# Patient Record
Sex: Female | Born: 1990 | Race: Black or African American | Hispanic: No | Marital: Single | Smoking: Former smoker
Health system: Southern US, Community
[De-identification: ages and names within clinical notes are randomized; demographics above are authoritative.]

## PROBLEM LIST (undated history)

## (undated) ENCOUNTER — Inpatient Hospital Stay (HOSPITAL_COMMUNITY): Payer: Self-pay

## (undated) DIAGNOSIS — Z789 Other specified health status: Secondary | ICD-10-CM

## (undated) HISTORY — PX: WISDOM TOOTH EXTRACTION: SHX21

---
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
## 2012-01-19 NOTE — L&D Delivery Note (Signed)
Delivery Note At 3:39 AM a viable female was delivered via Vaginal, Spontaneous Delivery (Presentation: Right Occiput Anterior).  Loose nuchal cord, baby easily delivered through cord.  APGAR: 8, 9; weight pending.   Placenta status: Intact, Spontaneous.  Cord: 3 vessels with the following complications: None.  Cord pH: not collected  Anesthesia: Epidural  Episiotomy: None Lacerations: 2nd degree Suture Repair: 3.0 vicryl Est. Blood Loss (mL): 200  Mom to postpartum.  Baby to skin to skin immediately after delivery.  Breastfeeding Out patient circ Routine PP orders  Nadiah Corbit 09/07/2012, 4:36 AM

## 2012-04-21 ENCOUNTER — Encounter (HOSPITAL_COMMUNITY): Payer: Self-pay

## 2012-04-21 ENCOUNTER — Inpatient Hospital Stay (HOSPITAL_COMMUNITY)
Admission: AD | Admit: 2012-04-21 | Discharge: 2012-04-21 | Disposition: A | Discharge status: Home / Self Care | Payer: BC Managed Care – PPO | Source: Ambulatory Visit | Attending: Obstetrics and Gynecology | Admitting: Obstetrics and Gynecology

## 2012-04-21 DIAGNOSIS — R110 Nausea: Secondary | ICD-10-CM | POA: Insufficient documentation

## 2012-04-21 DIAGNOSIS — R109 Unspecified abdominal pain: Secondary | ICD-10-CM | POA: Insufficient documentation

## 2012-04-21 HISTORY — DX: Other specified health status: Z78.9

## 2012-04-21 LAB — URINE MICROSCOPIC-ADD ON

## 2012-04-21 LAB — URINALYSIS, ROUTINE W REFLEX MICROSCOPIC
Glucose, UA: NEGATIVE mg/dL
Protein, ur: NEGATIVE mg/dL
Specific Gravity, Urine: 1.025 (ref 1.005–1.030)
pH: 6 (ref 5.0–8.0)

## 2012-04-21 LAB — WET PREP, GENITAL

## 2012-04-21 MED ORDER — METRONIDAZOLE 500 MG PO TABS: 500.0000 mg | ORAL_TABLET | Freq: Two times a day (BID) | ORAL | Status: DC

## 2012-04-21 MED ORDER — NITROFURANTOIN MONOHYD MACRO 100 MG PO CAPS: 100.0000 mg | ORAL_CAPSULE | Freq: Two times a day (BID) | ORAL | Status: DC

## 2012-04-21 NOTE — MAU Note (Signed)
Patient states she has been having lower abdominal pain for a couple of weeks off and on, getting worse today. Denies bleeding or discharge. Does have nausea, no vomiting.

## 2012-09-06 ENCOUNTER — Inpatient Hospital Stay (HOSPITAL_COMMUNITY)
Admission: AD | Admit: 2012-09-06 | Discharge: 2012-09-06 | Disposition: A | Discharge status: Home / Self Care | Payer: BC Managed Care – PPO | Source: Ambulatory Visit | Attending: Obstetrics and Gynecology | Admitting: Obstetrics and Gynecology

## 2012-09-06 ENCOUNTER — Inpatient Hospital Stay (HOSPITAL_COMMUNITY): Payer: BC Managed Care – PPO | Admitting: Anesthesiology

## 2012-09-06 ENCOUNTER — Inpatient Hospital Stay (HOSPITAL_COMMUNITY)
Admission: AD | Admit: 2012-09-06 | Discharge: 2012-09-09 | DRG: 373 | Disposition: A | Discharge status: Home / Self Care | Payer: BC Managed Care – PPO | Source: Ambulatory Visit | Attending: Obstetrics and Gynecology | Admitting: Obstetrics and Gynecology

## 2012-09-06 ENCOUNTER — Encounter (HOSPITAL_COMMUNITY): Payer: Self-pay | Admitting: Anesthesiology

## 2012-09-06 ENCOUNTER — Encounter (HOSPITAL_COMMUNITY): Payer: Self-pay | Admitting: *Deleted

## 2012-09-06 DIAGNOSIS — O99820 Streptococcus B carrier state complicating pregnancy: Secondary | ICD-10-CM

## 2012-09-06 DIAGNOSIS — B373 Candidiasis of vulva and vagina: Secondary | ICD-10-CM | POA: Insufficient documentation

## 2012-09-06 DIAGNOSIS — E669 Obesity, unspecified: Secondary | ICD-10-CM | POA: Diagnosis present

## 2012-09-06 DIAGNOSIS — O360990 Maternal care for other rhesus isoimmunization, unspecified trimester, not applicable or unspecified: Secondary | ICD-10-CM | POA: Diagnosis present

## 2012-09-06 DIAGNOSIS — Z6791 Unspecified blood type, Rh negative: Secondary | ICD-10-CM | POA: Diagnosis present

## 2012-09-06 DIAGNOSIS — O26899 Other specified pregnancy related conditions, unspecified trimester: Secondary | ICD-10-CM | POA: Diagnosis present

## 2012-09-06 DIAGNOSIS — O479 False labor, unspecified: Secondary | ICD-10-CM | POA: Insufficient documentation

## 2012-09-06 DIAGNOSIS — A749 Chlamydial infection, unspecified: Secondary | ICD-10-CM | POA: Insufficient documentation

## 2012-09-06 DIAGNOSIS — Z22330 Carrier of Group B streptococcus: Secondary | ICD-10-CM

## 2012-09-06 DIAGNOSIS — IMO0001 Reserved for inherently not codable concepts without codable children: Secondary | ICD-10-CM

## 2012-09-06 DIAGNOSIS — N390 Urinary tract infection, site not specified: Secondary | ICD-10-CM | POA: Insufficient documentation

## 2012-09-06 DIAGNOSIS — O99892 Other specified diseases and conditions complicating childbirth: Secondary | ICD-10-CM | POA: Diagnosis present

## 2012-09-06 LAB — CBC
HCT: 37.3 % (ref 36.0–46.0)
Hemoglobin: 12.5 g/dL (ref 12.0–15.0)
MCV: 85.9 fL (ref 78.0–100.0)
RBC: 4.34 MIL/uL (ref 3.87–5.11)
WBC: 19.6 10*3/uL — ABNORMAL HIGH (ref 4.0–10.5)

## 2012-09-06 LAB — OB RESULTS CONSOLE RPR: RPR: NONREACTIVE

## 2012-09-06 LAB — OB RESULTS CONSOLE GC/CHLAMYDIA: Chlamydia: NEGATIVE

## 2012-09-06 LAB — OB RESULTS CONSOLE HEPATITIS B SURFACE ANTIGEN: Hepatitis B Surface Ag: NEGATIVE

## 2012-09-06 MED ORDER — OXYTOCIN BOLUS FROM INFUSION
500.0000 mL | INTRAVENOUS | Status: DC
  Administered 2012-09-07: 500 mL via INTRAVENOUS

## 2012-09-06 MED ORDER — OXYTOCIN 40 UNITS IN LACTATED RINGERS INFUSION - SIMPLE MED
62.5000 mL/h | INTRAVENOUS | Status: DC
  Administered 2012-09-07: 62.5 mL/h via INTRAVENOUS
  Filled 2012-09-06: qty 1000

## 2012-09-06 MED ORDER — LIDOCAINE HCL (PF) 1 % IJ SOLN
30.0000 mL | INTRAMUSCULAR | Status: DC | PRN
  Filled 2012-09-06 (×2): qty 30

## 2012-09-06 MED ORDER — LACTATED RINGERS IV SOLN: 500.0000 mL | INTRAVENOUS | Status: DC | PRN

## 2012-09-06 MED ORDER — PENICILLIN G POTASSIUM 5000000 UNITS IJ SOLR
5.0000 10*6.[IU] | Freq: Once | INTRAMUSCULAR | Status: AC
  Administered 2012-09-06: 5 10*6.[IU] via INTRAVENOUS
  Filled 2012-09-06: qty 5

## 2012-09-06 MED ORDER — PROMETHAZINE HCL 25 MG/ML IJ SOLN
12.5000 mg | Freq: Once | INTRAMUSCULAR | Status: AC
  Administered 2012-09-06: 12.5 mg via INTRAMUSCULAR
  Filled 2012-09-06: qty 1

## 2012-09-06 MED ORDER — PHENYLEPHRINE 40 MCG/ML (10ML) SYRINGE FOR IV PUSH (FOR BLOOD PRESSURE SUPPORT)
80.0000 ug | PREFILLED_SYRINGE | INTRAVENOUS | Status: DC | PRN
  Filled 2012-09-06: qty 2

## 2012-09-06 MED ORDER — LACTATED RINGERS IV SOLN
500.0000 mL | Freq: Once | INTRAVENOUS | Status: AC
  Administered 2012-09-06: 500 mL via INTRAVENOUS

## 2012-09-06 MED ORDER — CITRIC ACID-SODIUM CITRATE 334-500 MG/5ML PO SOLN: 30.0000 mL | ORAL | Status: DC | PRN

## 2012-09-06 MED ORDER — IBUPROFEN 600 MG PO TABS: 600.0000 mg | ORAL_TABLET | Freq: Four times a day (QID) | ORAL | Status: DC | PRN

## 2012-09-06 MED ORDER — ZOLPIDEM TARTRATE 5 MG PO TABS: 5.0000 mg | ORAL_TABLET | Freq: Every evening | ORAL | Status: DC | PRN

## 2012-09-06 MED ORDER — EPHEDRINE 5 MG/ML INJ
10.0000 mg | INTRAVENOUS | Status: DC | PRN
  Filled 2012-09-06: qty 2

## 2012-09-06 MED ORDER — OXYCODONE-ACETAMINOPHEN 5-325 MG PO TABS: 1.0000 | ORAL_TABLET | ORAL | Status: DC | PRN

## 2012-09-06 MED ORDER — LACTATED RINGERS IV SOLN
INTRAVENOUS | Status: DC
  Administered 2012-09-06: 23:00:00 via INTRAVENOUS

## 2012-09-06 MED ORDER — PHENYLEPHRINE 40 MCG/ML (10ML) SYRINGE FOR IV PUSH (FOR BLOOD PRESSURE SUPPORT)
80.0000 ug | PREFILLED_SYRINGE | INTRAVENOUS | Status: DC | PRN
  Filled 2012-09-06: qty 5
  Filled 2012-09-06: qty 2

## 2012-09-06 MED ORDER — ACETAMINOPHEN 325 MG PO TABS: 650.0000 mg | ORAL_TABLET | ORAL | Status: DC | PRN

## 2012-09-06 MED ORDER — BUTORPHANOL TARTRATE 1 MG/ML IJ SOLN
2.0000 mg | Freq: Once | INTRAMUSCULAR | Status: AC
  Administered 2012-09-06: 2 mg via INTRAMUSCULAR
  Filled 2012-09-06: qty 2

## 2012-09-06 MED ORDER — DIPHENHYDRAMINE HCL 50 MG/ML IJ SOLN: 12.5000 mg | INTRAMUSCULAR | Status: DC | PRN

## 2012-09-06 MED ORDER — ONDANSETRON HCL 4 MG/2ML IJ SOLN: 4.0000 mg | Freq: Four times a day (QID) | INTRAMUSCULAR | Status: DC | PRN

## 2012-09-06 MED ORDER — FENTANYL 2.5 MCG/ML BUPIVACAINE 1/10 % EPIDURAL INFUSION (WH - ANES)
14.0000 mL/h | INTRAMUSCULAR | Status: DC | PRN
  Administered 2012-09-06: 14 mL/h via EPIDURAL
  Filled 2012-09-06: qty 125

## 2012-09-06 MED ORDER — SODIUM BICARBONATE 8.4 % IV SOLN
INTRAVENOUS | Status: DC | PRN
  Administered 2012-09-06: 5 mL via EPIDURAL

## 2012-09-06 MED ORDER — PENICILLIN G POTASSIUM 5000000 UNITS IJ SOLR
2.5000 10*6.[IU] | INTRAMUSCULAR | Status: DC
  Administered 2012-09-07: 2.5 10*6.[IU] via INTRAVENOUS
  Filled 2012-09-06 (×5): qty 2.5

## 2012-09-06 MED ORDER — EPHEDRINE 5 MG/ML INJ
10.0000 mg | INTRAVENOUS | Status: DC | PRN
  Filled 2012-09-06: qty 4
  Filled 2012-09-06: qty 2

## 2012-09-06 NOTE — Anesthesia Preprocedure Evaluation (Signed)

## 2012-09-06 NOTE — Anesthesia Procedure Notes (Signed)

## 2012-09-06 NOTE — MAU Note (Signed)
UC's since 0530, regular since 0600. No leaking or bleeding. States was not dilated when examined in office yesterday.

## 2012-09-06 NOTE — MAU Note (Signed)
Pt presents with increase in contractions since previous discharge earlier today.  Pt denies pih symptoms , srom, bleeding.  + fm per pt.

## 2012-09-06 NOTE — Progress Notes (Signed)
  Subjective: Pt comfortable now with epidural.  Discussed R&B of AROM, pt voiced understanding and wished to proceed with AROM.  Objective: BP 118/64  Pulse 108  Temp(Src) 98 F (36.7 C) (Oral)  Resp 18  Ht 5\' 4"  (1.626 m)  Wt 218 lb 12.8 oz (99.247 kg)  BMI 37.54 kg/m2  SpO2 100%      FHT:  Cat II with x1 decel at 2357 UC:   regular, every 1.5-5 minutes  SVE:   Dilation: 5 Effacement (%): 90 Station: -1 Exam by:: J. Idaly Verret CNM  Assessment / Plan:  Labor: Active labor; AROM at 2352 - light mec Preeclampsia: no s/s Fetal Wellbeing:Cat II Pain Control: Epidural  I/D: GBS pos; AROM; afibrile Anticipated MOD: SVD   Courtney Moses 09/06/2012, 11:59 PM

## 2012-09-06 NOTE — MAU Provider Note (Signed)
  History   pt came in c/o ctxs.  No LOF or VB good Fm  CSN: [id number]  Arrival date and time: 09/06/12 4098   None     Chief Complaint  Patient presents with  . Contractions   HPI  OB History   Grav Para Term Preterm Abortions TAB SAB Ect Mult Living   1         0      Past Medical History  Diagnosis Date  . Medical history non-contributory     Past Surgical History  Procedure Laterality Date  . Wisdom tooth extraction      No family history on file.  History  Substance Use Topics  . Smoking status: Never Smoker   . Smokeless tobacco: Not on file  . Alcohol Use: No     Comment: occassionally    Allergies: No Known Allergies  Prescriptions prior to admission  Medication Sig Dispense Refill  . metroNIDAZOLE (FLAGYL) 500 MG tablet Take 1 tablet (500 mg total) by mouth 2 (two) times daily.  14 tablet  0  . nitrofurantoin, macrocrystal-monohydrate, (MACROBID) 100 MG capsule Take 1 capsule (100 mg total) by mouth 2 (two) times daily.  14 capsule  0    ROS Physical Exam   Blood pressure 123/75, pulse 92, temperature 98.6 F (37 C), temperature source Oral, resp. rate 20, height 5\' 4"  (1.626 m), weight 222 lb (100.699 kg), SpO2 99.00%.  Physical Exam cx FT/50?-3 MAU Course  Procedures  MDM   Assessment and Plan  NSt category 1 Pt will walk for one hour. If no cervical change DC home with labor precautions.    Ronny Ruddell A 09/06/2012, 10:09 AM

## 2012-09-06 NOTE — H&P (Signed)
Courtney Moses is a 22 y.o. female, G1P0 at [redacted]w[redacted]d, presenting for UC's since 0530, regular since 0600.  Denies VB, LOF, recent fever, resp or GI c/o's, UTI or PIH s/s. GFM. Desires epidural. .  Patient Active Problem List   Diagnosis Date Noted  . Chlamydia infection, current pregnancy 09/06/2012  . Candidal vulvovaginitis 09/06/2012  . UTI (urinary tract infection) 09/06/2012  . GBS (group B Streptococcus carrier), +RV culture, currently pregnant 09/06/2012  . Rh negative status during pregnancy 09/06/2012  . Active labor at term 09/06/2012    History of present pregnancy: Patient entered care at 22 weeks.   EDC of 09/18/12 was established by LMP.   Anatomy scan:  22 weeks, limited with normal findings and an anterior placenta.   Additional [place] evaluations:  27 weeks for anatomy f/u - anatomy scan completed.   Significant prenatal events:  CT infection in early pregnancy; Rhogam at 34 weeks   Last evaluation:  09/05/12 at [redacted]w[redacted]d   0 cm / 50% / -3  OB History   Grav Para Term Preterm Abortions TAB SAB Ect Mult Living   1         0     Past Medical History  Diagnosis Date  . Medical history non-contributory    Past Surgical History  Procedure Laterality Date  . Wisdom tooth extraction     Family History: family history is not on file. Social History:  reports that she has never smoked. She does not have any smokeless tobacco history on file. She reports that she does not drink alcohol or use illicit drugs.   Prenatal Transfer Tool  Maternal Diabetes: No Genetic Screening: Normal Maternal Ultrasounds/Referrals: Normal Fetal Ultrasounds or other Referrals:  None Maternal Substance Abuse:  No Significant Maternal Medications:  None Significant Maternal Lab Results: Lab values include: Group B Strep positive, Rh negative    ROS: see HPI above, all other systems are negative  No Known Allergies   Dilation: 4.5 Effacement (%): 70 Station: -2 Exam by:: a. harper,  rnc-ob Blood pressure 122/73, pulse 91, temperature 96.6 F (35.9 C), temperature source Axillary, resp. rate 20, height 5\' 4"  (1.626 m), weight 218 lb 12.8 oz (99.247 kg).  Chest clear Heart RRR without murmur Abd gravid, NT Ext: WNL  FHR: Reactive NST UCs:  Q 3-6 min  Prenatal labs: ABO, Rh: O/Negative/-- (08/20 0000) Antibody: Negative (08/20 0000) Rubella:   Immune RPR: Nonreactive (08/20 0000)  HBsAg: Negative (08/20 0000)  HIV: Non-reactive (08/20 0000)  GBS: Positive (08/20 0000) Sickle cell/Hgb electrophoresis:  Hemoglobin A2 is low - no clinical significance Pap:  unknown GC:  8/19 neg Chlamydia:  8/19 neg  (4/30 - pos) Genetic screenings:  AFP neg Glucola:  84 Other:  none  Assessment/Plan: IUP at [redacted]w[redacted]d Active labor GBS pos Desires epidural  Admit to BS per c/w Dr. Estanislado Pandy as attending MD Routine [facility] orders GBS PCN prophylaxis per protocol Epidural prn  Rowan Blase, MSN 09/06/2012, 8:56 PM

## 2012-09-07 ENCOUNTER — Encounter (HOSPITAL_COMMUNITY): Payer: Self-pay | Admitting: *Deleted

## 2012-09-07 MED ORDER — SENNOSIDES-DOCUSATE SODIUM 8.6-50 MG PO TABS
2.0000 | ORAL_TABLET | Freq: Every day | ORAL | Status: DC
  Administered 2012-09-07 – 2012-09-08 (×2): 2 via ORAL

## 2012-09-07 MED ORDER — TETANUS-DIPHTH-ACELL PERTUSSIS 5-2.5-18.5 LF-MCG/0.5 IM SUSP: 0.5000 mL | Freq: Once | INTRAMUSCULAR | Status: DC

## 2012-09-07 MED ORDER — OXYCODONE-ACETAMINOPHEN 5-325 MG PO TABS: 1.0000 | ORAL_TABLET | ORAL | Status: DC | PRN

## 2012-09-07 MED ORDER — IBUPROFEN 600 MG PO TABS
600.0000 mg | ORAL_TABLET | Freq: Four times a day (QID) | ORAL | Status: DC
  Administered 2012-09-07 – 2012-09-09 (×8): 600 mg via ORAL
  Filled 2012-09-07 (×8): qty 1

## 2012-09-07 MED ORDER — ONDANSETRON HCL 4 MG/2ML IJ SOLN: 4.0000 mg | INTRAMUSCULAR | Status: DC | PRN

## 2012-09-07 MED ORDER — ONDANSETRON HCL 4 MG PO TABS: 4.0000 mg | ORAL_TABLET | ORAL | Status: DC | PRN

## 2012-09-07 MED ORDER — DIPHENHYDRAMINE HCL 25 MG PO CAPS: 25.0000 mg | ORAL_CAPSULE | Freq: Four times a day (QID) | ORAL | Status: DC | PRN

## 2012-09-07 MED ORDER — WITCH HAZEL-GLYCERIN EX PADS: 1.0000 "application " | MEDICATED_PAD | CUTANEOUS | Status: DC | PRN

## 2012-09-07 MED ORDER — LANOLIN HYDROUS EX OINT: TOPICAL_OINTMENT | CUTANEOUS | Status: DC | PRN

## 2012-09-07 MED ORDER — PRENATAL MULTIVITAMIN CH
1.0000 | ORAL_TABLET | Freq: Every day | ORAL | Status: DC
  Administered 2012-09-07 – 2012-09-09 (×3): 1 via ORAL
  Filled 2012-09-07 (×3): qty 1

## 2012-09-07 MED ORDER — DIBUCAINE 1 % RE OINT: 1.0000 "application " | TOPICAL_OINTMENT | RECTAL | Status: DC | PRN

## 2012-09-07 MED ORDER — BENZOCAINE-MENTHOL 20-0.5 % EX AERO
1.0000 "application " | INHALATION_SPRAY | CUTANEOUS | Status: DC | PRN
  Administered 2012-09-07: 1 via TOPICAL
  Filled 2012-09-07: qty 56

## 2012-09-07 MED ORDER — MEDROXYPROGESTERONE ACETATE 150 MG/ML IM SUSP: 150.0000 mg | INTRAMUSCULAR | Status: DC | PRN

## 2012-09-07 MED ORDER — ZOLPIDEM TARTRATE 5 MG PO TABS: 5.0000 mg | ORAL_TABLET | Freq: Every evening | ORAL | Status: DC | PRN

## 2012-09-07 MED ORDER — SIMETHICONE 80 MG PO CHEW: 80.0000 mg | CHEWABLE_TABLET | ORAL | Status: DC | PRN

## 2012-09-07 NOTE — Progress Notes (Addendum)
  Subjective: Pt resting in a light sleep.  Pt feeling more "lower abdominal pressure" with UCs.   Objective: BP 122/70  Pulse 102  Temp(Src) 98.9 F (37.2 C) (Oral)  Resp 18  Ht 5\' 4"  (1.626 m)  Wt 218 lb 12.8 oz (99.247 kg)  BMI 37.54 kg/m2  SpO2 99%      FHT:  Cat II UC:   regular, every 1.5 - 2.5 minutes  SVE:   Dilation: 10 Effacement (%): 100 Station: 0 Exam by:: Annamary Rummage, CNM  Assessment / Plan:  Labor: Active labor; Complete; Will labor down for 1 hour Preeclampsia: no s/s  Fetal Wellbeing:Cat II  Pain Control: Epidural  I/D: GBS pos; AROM x 2 hours; afibrile  Anticipated MOD: SVD   Courtney Moses 09/07/2012, 2:15 AM

## 2012-09-08 LAB — CBC
Hemoglobin: 10.3 g/dL — ABNORMAL LOW (ref 12.0–15.0)
MCH: 29.4 pg (ref 26.0–34.0)
MCV: 87.4 fL (ref 78.0–100.0)
RBC: 3.5 MIL/uL — ABNORMAL LOW (ref 3.87–5.11)

## 2012-09-08 MED ORDER — RHO D IMMUNE GLOBULIN 1500 UNIT/2ML IJ SOLN
300.0000 ug | Freq: Once | INTRAMUSCULAR | Status: AC
  Administered 2012-09-08: 300 ug via INTRAMUSCULAR
  Filled 2012-09-08: qty 2

## 2012-09-08 NOTE — Progress Notes (Signed)
Post Partum Day 1 Subjective: no complaints, up ad lib, voiding and tolerating PO  Objective: Blood pressure 117/77, pulse 87, temperature 97.5 F (36.4 C), temperature source Axillary, resp. rate 18, height 5\' 4"  (1.626 m), weight 218 lb 12.8 oz (99.247 kg), SpO2 96.00%, bottle feeding  Physical Exam:  General: alert, cooperative, no distress and mildly obese Lochia: appropriate Uterine Fundus: firm DVT Evaluation: No evidence of DVT seen on physical exam.   Recent Labs  09/06/12 2015 09/08/12 0610  HGB 12.5 10.3*  HCT 37.3 30.6*    Assessment/Plan: Plan for discharge tomorrow   LOS: 2 days   Marl Seago P 09/08/2012, 10:19 AM

## 2012-09-08 NOTE — Anesthesia Postprocedure Evaluation (Signed)
  Anesthesia Post-op Note  Patient: Courtney Moses  Procedure(s) Performed: * No procedures listed *  Patient Location: PACU and Mother/Baby  Anesthesia Type:Epidural  Level of Consciousness: awake, alert  and oriented  Airway and Oxygen Therapy: Patient Spontanous Breathing  Post-op Pain: none  Post-op Assessment: Post-op Vital signs reviewed, Patient's Cardiovascular Status Stable and Respiratory Function Stable  Post-op Vital Signs: Reviewed and stable  Complications: No apparent anesthesia complications

## 2012-09-09 LAB — RH IG WORKUP (INCLUDES ABO/RH)

## 2012-09-09 MED ORDER — IBUPROFEN 600 MG PO TABS: 600.0000 mg | ORAL_TABLET | Freq: Four times a day (QID) | ORAL | Status: DC | PRN

## 2012-09-09 NOTE — Discharge Summary (Signed)
Obstetric Discharge Summary Reason for Admission: onset of labor Prenatal Procedures: ultrasound Intrapartum Procedures: spontaneous vaginal delivery Postpartum Procedures: none Complications-Operative and Postpartum: 2nd degree perineal laceration Hemoglobin  Date Value Range Status  09/08/2012 10.3* 12.0 - 15.0 g/dL Final     DELTA CHECK NOTED     REPEATED TO VERIFY     HCT  Date Value Range Status  09/08/2012 30.6* 36.0 - 46.0 % Final    Physical Exam:  General: alert, cooperative and no distress Heart:  RRR Lungs:  CTA bilat Breasts:  Soft Abd:  Soft, NT with pos BS x 4 quads Lochia: appropriate, sm rubra Uterine Fundus: firm, NT, 2 below umb Incision: Perineal laceration repair intact DVT Evaluation: No evidence of DVT seen on physical exam. Negative Homan's sign. No significant calf/ankle edema.  Discharge Diagnoses: Term Pregnancy-delivered  Discharge Information: Date: 09/09/2012 Activity: unrestricted Diet: routine Medications: PNV and Ibuprofen Condition: stable Instructions: refer to practice specific booklet Discharge to: home Follow-up Information   Follow up with [facility]. Schedule an appointment as soon as possible for a visit in 6 weeks.   Contact information:   [address] [place] [address]     Contraception:  Desires OCPs to start at 6wk PP visit  Newborn Data: Live born female  Birth Weight: 5 lb 13 oz (2637 g) APGAR: 8, 9 Plans office circ  Home with mother.  Geetika Laborde O. 09/09/2012, 11:29 AM

## 2012-09-09 NOTE — Progress Notes (Signed)
Post Partum Day 2 Subjective: Reports feeling well.  Ambulating, voiding and tol po liquids and solids without difficulty.  Denies weakness or dizziness.  Pos flatus, neg BM.  States she now is going to formula feed exclusively.  Reports mild perineal pain well controlled with Motrin only.  Objective: Blood pressure 109/73, pulse 78, temperature 97.8 F (36.6 C), temperature source Oral, resp. rate 18, height 5\' 4"  (1.626 m), weight 99.247 kg (218 lb 12.8 oz), SpO2 100.00%, unknown if currently breastfeeding.  Physical Exam:  General: alert, cooperative and no distress Heart:  RRR Lungs:  CTA bilat Breasts:  Soft Lochia: appropriate, sm rubra Uterine Fundus: firm, NT 2 below umb. Incision: Perineal laceration repair intact DVT Evaluation: No evidence of DVT seen on physical exam. Negative Homan's sign. No significant calf/ankle edema.   Recent Labs  09/06/12 2015 09/08/12 0610  HGB 12.5 10.3*  HCT 37.3 30.6*    Assessment/Plan: Stable s/p vaginal del at 38w 3d  Discharge to home. Discharge instructions reviewed. RTO 6 wks for f/u. Pt desires to use OCPs for contraception but declines rx at present.   Rx: Motrin, PNV   LOS: 3 days   Courtney Moses O. 09/09/2012, 11:25 AM

## 2012-09-10 LAB — TYPE AND SCREEN
ABO/RH(D): O NEG
Antibody Screen: POSITIVE
DAT, IgG: NEGATIVE
Unit division: 0

## 2013-11-19 ENCOUNTER — Encounter (HOSPITAL_COMMUNITY): Payer: Self-pay | Admitting: *Deleted

## 2015-01-17 ENCOUNTER — Emergency Department (HOSPITAL_COMMUNITY)
Admission: EM | Admit: 2015-01-17 | Discharge: 2015-01-17 | Disposition: A | Discharge status: Home / Self Care | Payer: BLUE CROSS/BLUE SHIELD | Attending: Emergency Medicine | Admitting: Emergency Medicine

## 2015-01-17 ENCOUNTER — Encounter (HOSPITAL_COMMUNITY): Payer: Self-pay | Admitting: Emergency Medicine

## 2015-01-17 DIAGNOSIS — R0602 Shortness of breath: Secondary | ICD-10-CM | POA: Diagnosis present

## 2015-01-17 DIAGNOSIS — F17200 Nicotine dependence, unspecified, uncomplicated: Secondary | ICD-10-CM | POA: Insufficient documentation

## 2015-01-17 NOTE — Discharge Instructions (Signed)
Do not hesitate to return to the emergency room for any new, worsening or concerning symptoms.  Please obtain primary care using resource guide below. Let them know that you were seen in the emergency room and that they will need to obtain records for further outpatient management.   Shortness of Breath Shortness of breath means you have trouble breathing. It could also mean that you have a medical problem. You should get immediate medical care for shortness of breath. CAUSES   Not enough oxygen in the air such as with high altitudes or a smoke-filled room.  Certain lung diseases, infections, or problems.  Heart disease or conditions, such as angina or heart failure.  Low red blood cells (anemia).  Poor physical fitness, which can cause shortness of breath when you exercise.  Chest or back injuries or stiffness.  Being overweight.  Smoking.  Anxiety, which can make you feel like you are not getting enough air. DIAGNOSIS  Serious medical problems can often be found during your physical exam. Tests may also be done to determine why you are having shortness of breath. Tests may include:  Chest X-rays.  Lung function tests.  Blood tests.  An electrocardiogram (ECG).  An ambulatory electrocardiogram. An ambulatory ECG records your heartbeat patterns over a 24-hour period.  Exercise testing.  A transthoracic echocardiogram (TTE). During echocardiography, sound waves are used to evaluate how blood flows through your heart.  A transesophageal echocardiogram (TEE).  Imaging scans. Your health care provider may not be able to find a cause for your shortness of breath after your exam. In this case, it is important to have a follow-up exam with your health care provider as directed.  TREATMENT  Treatment for shortness of breath depends on the cause of your symptoms and can vary greatly. HOME CARE INSTRUCTIONS   Do not smoke. Smoking is a common cause of shortness of breath. If  you smoke, ask for help to quit.  Avoid being around chemicals or things that may bother your breathing, such as paint fumes and dust.  Rest as needed. Slowly resume your usual activities.  If medicines were prescribed, take them as directed for the full length of time directed. This includes oxygen and any inhaled medicines.  Keep all follow-up appointments as directed by your health care provider. SEEK MEDICAL CARE IF:   Your condition does not improve in the time expected.  You have a hard time doing your normal activities even with rest.  You have any new symptoms. SEEK IMMEDIATE MEDICAL CARE IF:   Your shortness of breath gets worse.  You feel light-headed, faint, or develop a cough not controlled with medicines.  You start coughing up blood.  You have pain with breathing.  You have chest pain or pain in your arms, shoulders, or abdomen.  You have a fever.  You are unable to walk up stairs or exercise the way you normally do. MAKE SURE YOU:  Understand these instructions.  Will watch your condition.  Will get help right away if you are not doing well or get worse.   This information is not intended to replace advice given to you by your health care provider. Make sure you discuss any questions you have with your health care provider.   Document Released: 09/29/2000 Document Revised: 01/09/2013 Document Reviewed: 03/22/2011 Elsevier Interactive Patient Education 2016 [employer]   Emergency Department Resource Guide 1) Find a Doctor and Pay Out of Pocket Although you won't have to find out who  is covered by your insurance plan, it is a good idea to ask around and get recommendations. You will then need to call the office and see if the doctor you have chosen will accept you as a new patient and what types of options they offer for patients who are self-pay. Some doctors offer discounts or will set up payment plans for their patients who do not have insurance, but  you will need to ask so you aren't surprised when you get to your appointment.  2) Contact Your Local Health Department Not all health departments have doctors that can see patients for sick visits, but many do, so it is worth a call to see if yours does. If you don't know where your local health department is, you can check in your phone book. The CDC also has a tool to help you locate your state's health department, and many state websites also have listings of all of their local health departments.  3) Find a Walk-in Clinic If your illness is not likely to be very severe or complicated, you may want to try a walk in clinic. These are popping up all over the country in pharmacies, drugstores, and shopping centers. They're usually staffed by nurse practitioners or physician assistants that have been trained to treat common illnesses and complaints. They're usually fairly quick and inexpensive. However, if you have serious medical issues or chronic medical problems, these are probably not your best option.  No Primary Care Doctor: - Call Health Connect at  [phone] - they can help you locate a primary care doctor that  accepts your insurance, provides certain services, etc. - Physician Referral Service- [phone]  Chronic Pain Problems: Organization         Address  Phone   Notes  [facility]  [phone] Patients need to be referred by their primary care doctor.   Medication Assistance: Organization         Address  Phone   Notes  [facility] Medication [facility] [address] [place] [place], [place] [address] [phone] --Must be a resident of [facility] -- Must have NO insurance coverage whatsoever (no Medicaid/ Medicare, etc.) -- The pt. MUST have a primary care doctor that directs their care regularly and follows them in the community   [facility]ist  [phone]   [employer]  [phone]    Agencies that provide inexpensive  medical care: Organization         Address  Phone   Notes  [facility]  [phone]   [facility]    [phone]   [facility] [address] [place], [place] [address] [phone]   [facility] [address] [place] [place] [phone]   Planned Parenthood    [phone]   [facility]    [phone]   Community Health and [facility]  [address], [place] Phone:  [phone], Fax:  [phone] Hours of Operation:  9 am - 6 pm, M-F.  Also accepts Medicaid/Medicare and self-pay.  [facility]  [address], [place] Phone: [phone], Fax: [phone]. Hours of Operation:  8:30 am - 5:30 pm, M-F.  Also accepts Medicaid and self-pay.  [facility] [place] [address], [place] Phone: [phone]   [facility] [address] Natasha Bence [place], [place] [phone],  Ext. [phone] Mondays & Thursdays: 7-9 AM.  First 15 patients are seen on a first come, first serve basis.    Medicaid-accepting [facility] Providers:  Organization         Address  Phone   Notes  [facility] [address], [place] [phone]) [phone] Also accepts self-pay patients.  [facility] [address], [place]  [phone]) [phone]   [facility] [address], [place] [phone]) [phone]   [facility] [address] [place] [address], [place] [phone]   Renaye RakersVeita Bland [address], [place]   [phone] Only accepts [place] Access [place] patients after they have their name applied to their card.   Self-Pay (no insurance) in [facility]:  Organization         Address  Phone   Notes  Sickle Cell Patients, [facility] [address], [place] [phone]) [phone]   [facility] [address], [place] [phone]) [phone]   [facility]  [address], [place] [phone]) [phone]   [facility]/Dr. Osei-Bonsu  [address] [place] [address], [place] or [phone], [place] [phone] Phone number for both [place] and [place] locations is the same.  [facility] [address], [place] [phone]) [phone]   [facility] [address] [place] [address], [place] or [address] [phone]) [phone]   [facility] [address], [place] [phone]) [phone], phone; [phone]) [phone], fax Sees patients 1st and 3rd Saturday of every month.  Must not qualify for public or private insurance (i.e. Medicaid, Medicare, [place] Health Choice, Veterans' Benefits)  Household income should be no more than 200% of the poverty level The clinic cannot treat you if you are pregnant or think you are pregnant  Sexually transmitted diseases are not treated at the clinic.    Dental Care: Organization         Address  Phone  Notes  [facility] Department of [facility] [address], [place] [phone] Accepts children up to age 22 who are enrolled in [place] or [place] Health Choice; pregnant women with a Medicaid card; and children who have applied for Medicaid or [place] Health Choice, but were declined, whose parents can pay a reduced fee at time of service.  [facility] of [facility] [place]  [address], [place] [phone]) [phone] Accepts children up to age 22 who are enrolled in [place] or [place] Health Choice; pregnant women with a Medicaid card; and children who have applied for Medicaid or [place] Health Choice, but were declined, whose parents can pay a reduced fee at time of service.  [facility]  [address], [place] [phone]) [phone] Patients are seen by appointment only. Walk-ins are not accepted.  [facility] will see patients 22 years of age and older. Monday - Tuesday (8am-5pm) Most Wednesdays (8:30-5pm) $30 per visit, cash only  [facility]  [address], [facility] [phone]) [phone] Patients are seen by appointment only. Walk-ins are not accepted. [facility] will see patients 22 years of age and older. One Wednesday Evening (Monthly: Volunteer Based).  $30 per visit, cash only  [facility]  [phone]  for adults; Children under age 26, call Graduate Pediatric Dentistry at [phone]. Children aged 49-14, please call [phone] to request a pediatric application.  Dental services are provided in all areas of dental care including fillings, crowns and bridges, complete and partial dentures, implants, gum treatment, root canals, and extractions. Preventive care is also provided. Treatment is provided to both adults and children. Patients are selected via a lottery and there is often a waiting list.   [facility] [address], [place]  [phone] [email or web address]   [facility] [address] [place], [place] [phone], Ext. [phone] Second and Fourth Thursday of each month, opens at 6:30 AM; Clinic ends at 9 AM.  Patients are seen on a first-come first-served basis, and a limited number are seen during each clinic.   [facility]  [address] Ether [address]ins [place], [place] [phone]   Eligibility Requirements You must have lived in [place], [place], or [place] counties for at least the last three months.   You cannot be eligible for state or federal sponsored [place], including [employer], [place], or [employer].   You generally cannot be eligible for healthcare insurance through your employer.    How to apply: Eligibility screenings are held every Tuesday and Wednesday afternoon from 1:00 pm until 4:00 pm. You do not need an appointment for the  interview!  [facility] [address] [place], [place] [phone]   [facility] Health Department  [phone]   [facility] Health Department  [phone]   [facility] Health Department  [phone]    Behavioral Health Resources in the Community: Intensive Outpatient Programs Organization         Address  Phone  Notes  [facility] [address], [place], [place] [phone]   [facility]patient [address] [address] [place], [place] [phone]   [facility] [address] [place], [place]  [phone]   [facility] Mental Health [address] [place] [address]  [place], [place] [phone] or [phone]   Substance Abuse Resources Organization         Address  Phone  Notes  [facility] Drug [facility]  [phone]   [facility]  [phone]   [facility] [place]  [phone]   Floydene Flock  [phone]   Residential & Outpatient Substance Abuse Program  [phone]   Psychological Services Organization         Address  Phone  Notes  [facility] Behavioral Health  [phone]   [facility]  [phone]   [facility] Mental Health [address] [place] [phone] or [phone]    Mobile Crisis Teams Organization         Address  Phone  Notes  [facility], Mobile Crisis Care Unit  [phone]   [facility]  [address] [place], [place] [phone]   Doristine Locks [address] [place] [phone]    Self-Help/Support Groups Organization         Address  Phone             Notes  [facility] [place] - variety of support groups  [phone]- I7437963 Call for more information  Narcotics Anonymous (NA), [facility] [address], [employer] [place]  2 meetings at this location   Chief Executive Officer  Notes  ASAP  Residential Treatment  [address]    [place]  [phone]   [facility]  [address] [place] [address], [place], [place] [phone]   [facility] Treatment Facility [address] [place], [place] [phone] Admissions: 8am-3pm M-F  [facility] [address],    [place], [place] [phone]   [facility] [address] [place], [place], [place] [phone]   [facility] [address]  [place], [place] [phone]   [facility] - Intensive Outpatient [address], [place], [place] [phone]   [facility] ([facility] [address] [place]  [place], [place] [phone] or [phone]   [facility] ([facility]) [address], [place], [place] [phone] Accepts Medicaid  [facility] [place] [address],  [place] [phone] Substance Abuse/Addiction Treatment   [facility] Organization         Address  Phone  Notes  [facility]  [phone]   Angie Fava, PhD [address] Ervin Knack [place], [place]   [phone] or [phone]   [facility]   [address] [place], [place] [phone]   [facility] [address] [place], [place] [phone] Insurance/Medicaid/sponsorship through [facility] [address]                                    [place], [place] [phone] Therapy/tele-psych/case  [facility] [address], [place] [phone]    Dr. Lolly Mustache  [phone]   [facility] [place]  [employer] [facility] Dept. 1) [address] [place] 2) [address], [place] 3)  [address], [place] [phone]  [phone]   [facility] Child Abuse Hotline [phone] or [phone] (After Hours)

## 2015-01-17 NOTE — ED Provider Notes (Signed)
CSN: [id number]     Arrival date & time 01/17/15  0432 History   First MD Initiated Contact with Patient 01/17/15 [phone]     (Consider location/radiation/quality/duration/timing/severity/associated sxs/prior Treatment) HPI   Blood pressure 123/58, pulse 97, temperature 98 F (36.7 C), temperature source Oral, resp. rate 18, last menstrual period 01/10/2015, SpO2 100 %, not currently breastfeeding.  Courtney Moses is a 24 y.o. female BIBEMS for acute onset of shortness of breath, patient got off work went home, took a shower and laid down and immediately felt her throat closing. EMS was called they gave her an albuterol treatment which immediately resolved her symptoms, was given 125 of Solu-Medrol, 50 of Benadryl and route. She denies rash, nausea, vomiting, history of allergies, new medications, new pets, soaps, lotions, detergents. Patient states that taking about issues father at the onset of the episode of shortness of breath. She denies suicidal ideation, homicidal ideation, alcohol or drug abuse, chest pain, cough, fever, history of DVT or PE, recent immobilizations, calf pain, leg swelling.   Past Medical History  Diagnosis Date  . Medical history non-contributory    Past Surgical History  Procedure Laterality Date  . Wisdom tooth extraction     No family history on file. Social History  Substance Use Topics  . Smoking status: Current Some Day Smoker  . Smokeless tobacco: None  . Alcohol Use: No     Comment: occassionally   OB History    Gravida Para Term Preterm AB TAB SAB Ectopic Multiple Living   Review of Systems  10 systems reviewed and found to be negative, except as noted in the HPI.   Allergies  Review of patient's allergies indicates no known allergies.  Home Medications   Prior to Admission medications   Medication Sig Start Date End Date Taking? Authorizing Provider  ibuprofen (ADVIL,MOTRIN) 600 MG tablet Take 1 tablet (600 mg total) by  mouth every 6 (six) hours as needed for pain. 09/09/12   Elsie Ra, CNM   BP 123/58 mmHg  Pulse 97  Temp(Src) 98 F (36.7 C) (Oral)  Resp 18  SpO2 100%  LMP 01/10/2015  Breastfeeding? No Physical Exam  Constitutional: She is oriented to person, place, and time. She appears well-developed and well-nourished. No distress.  HENT:  Head: Normocephalic and atraumatic.  Mouth/Throat: Oropharynx is clear and moist.  Eyes: Conjunctivae and EOM are normal. Pupils are equal, round, and reactive to light.  Neck: Normal range of motion. No JVD present. No tracheal deviation present.  Cardiovascular: Normal rate, regular rhythm and intact distal pulses.   Radial pulse equal bilaterally  Pulmonary/Chest: Effort normal and breath sounds normal. No stridor. No respiratory distress. She has no wheezes. She has no rales. She exhibits no tenderness.  Abdominal: Soft. She exhibits no distension and no mass. There is no tenderness. There is no rebound and no guarding.  Musculoskeletal: Normal range of motion. She exhibits no edema or tenderness.  No calf asymmetry, superficial collaterals, palpable cords, edema, Homans sign negative bilaterally.    Neurological: She is alert and oriented to person, place, and time.  Skin: Skin is warm. She is not diaphoretic.  Psychiatric: She has a normal mood and affect.  Nursing note and vitals reviewed.   ED Course  Procedures (including critical care time) Labs Review Labs Reviewed - No data to display  Imaging Review No results found. I have personally reviewed and evaluated  these images and lab results as part of my medical decision-making.   EKG Interpretation None      MDM   Final diagnoses:  SOB (shortness of breath)    Filed Vitals:   01/17/15 0435  BP: 123/58  Pulse: 97  Temp: 98 F (36.7 C)  TempSrc: Oral  Resp: 18  SpO2: 100%   Courtney Moses is 24 y.o. female presenting with acute onset of isolated shortness of breath, no rash,  symptoms resolved immediately with albuterol treatment via EMS. Patient has no history of asthma. No rash, nausea to suggest anaphylaxis. I think it would be very atypical if this with allergic reaction that resolved completely with albuterol. Patient states that she was in a stressful situation before the onset of the symptoms, I think it's possible that this may be related to anxiety. Patient is not hypoxic or tachycardic, physical exam is not consistent with DVT, I doubt this is a PE. Patient observed in the ED with no return of symptoms.  Evaluation does not show pathology that would require ongoing emergent intervention or inpatient treatment. Pt is hemodynamically stable and mentating appropriately. Discussed findings and plan with patient/guardian, who agrees with care plan. All questions answered. Return precautions discussed and outpatient follow up given.    Wynetta Emeryicole Chaneka Trefz, PA-C 01/17/15 0559  Zadie Rhineonald Wickline, MD 01/17/15 [phone]

## 2015-01-17 NOTE — ED Notes (Signed)
Per EMS pt stepped out of shower and started feeling like her throat was closing up; pt unable to identify cause of reaction; en route 5mg  albuterol, 1255 mg solumedrol; 50mg  benadryl; 20 G in Left AC; per Ems pt denies hx of asthma nor any other pertinent medical history; EMS heard wheezing in left upper lobe and diminished in all other fields

## 2015-05-03 ENCOUNTER — Emergency Department (HOSPITAL_COMMUNITY)
Admission: EM | Admit: 2015-05-03 | Discharge: 2015-05-03 | Discharge status: Against Medical Advice | Payer: BLUE CROSS/BLUE SHIELD

## 2015-05-03 NOTE — ED Notes (Signed)
Pt advised by NT that children were not allowed in the treatment area at this time. Pt swiftly left the building

## 2016-06-09 DIAGNOSIS — B009 Herpesviral infection, unspecified: Secondary | ICD-10-CM | POA: Insufficient documentation

## 2017-12-18 ENCOUNTER — Emergency Department (HOSPITAL_COMMUNITY): Payer: BLUE CROSS/BLUE SHIELD

## 2017-12-18 ENCOUNTER — Emergency Department (HOSPITAL_COMMUNITY)
Admission: EM | Admit: 2017-12-18 | Discharge: 2017-12-18 | Disposition: A | Discharge status: Home / Self Care | Payer: BLUE CROSS/BLUE SHIELD | Attending: Emergency Medicine | Admitting: Emergency Medicine

## 2017-12-18 ENCOUNTER — Encounter (HOSPITAL_COMMUNITY): Payer: Self-pay | Admitting: Emergency Medicine

## 2017-12-18 DIAGNOSIS — Y929 Unspecified place or not applicable: Secondary | ICD-10-CM | POA: Diagnosis not present

## 2017-12-18 DIAGNOSIS — Y939 Activity, unspecified: Secondary | ICD-10-CM | POA: Insufficient documentation

## 2017-12-18 DIAGNOSIS — Y998 Other external cause status: Secondary | ICD-10-CM | POA: Diagnosis not present

## 2017-12-18 DIAGNOSIS — F17200 Nicotine dependence, unspecified, uncomplicated: Secondary | ICD-10-CM | POA: Insufficient documentation

## 2017-12-18 DIAGNOSIS — S161XXA Strain of muscle, fascia and tendon at neck level, initial encounter: Secondary | ICD-10-CM | POA: Insufficient documentation

## 2017-12-18 DIAGNOSIS — Y33XXXA Other specified events, undetermined intent, initial encounter: Secondary | ICD-10-CM | POA: Insufficient documentation

## 2017-12-18 DIAGNOSIS — R0789 Other chest pain: Secondary | ICD-10-CM | POA: Insufficient documentation

## 2017-12-18 DIAGNOSIS — R079 Chest pain, unspecified: Secondary | ICD-10-CM | POA: Diagnosis present

## 2017-12-18 LAB — I-STAT BETA HCG BLOOD, ED (MC, WL, AP ONLY): HCG, QUANTITATIVE: 5.7 m[IU]/mL — AB (ref <5)

## 2017-12-18 LAB — BASIC METABOLIC PANEL
Anion gap: 10 (ref 5–15)
BUN: 10 mg/dL (ref 6–20)
CO2: 24 mmol/L (ref 22–32)
Calcium: 8.8 mg/dL — ABNORMAL LOW (ref 8.9–10.3)
Chloride: 104 mmol/L (ref 98–111)
Creatinine, Ser: 0.88 mg/dL (ref 0.44–1.00)
GFR calc Af Amer: >60 mL/min (ref >60)
Glucose, Bld: 92 mg/dL (ref 70–99)
POTASSIUM: 3.6 mmol/L (ref 3.5–5.1)
Sodium: 138 mmol/L (ref 135–145)

## 2017-12-18 LAB — CBC WITH DIFFERENTIAL/PLATELET
ABS IMMATURE GRANULOCYTES: 0.03 10*3/uL (ref 0.00–0.07)
Basophils Absolute: 0.1 10*3/uL (ref 0.0–0.1)
Basophils Relative: 1 %
Eosinophils Absolute: 0.3 10*3/uL (ref 0.0–0.5)
Eosinophils Relative: 2 %
HCT: 42.1 % (ref 36.0–46.0)
Hemoglobin: 12.8 g/dL (ref 12.0–15.0)
IMMATURE GRANULOCYTES: 0 %
LYMPHS ABS: 4.3 10*3/uL — AB (ref 0.7–4.0)
LYMPHS PCT: 35 %
MCH: 27.8 pg (ref 26.0–34.0)
MCHC: 30.4 g/dL (ref 30.0–36.0)
MCV: 91.3 fL (ref 80.0–100.0)
MONOS PCT: 6 %
Monocytes Absolute: 0.7 10*3/uL (ref 0.1–1.0)
NRBC: 0 % (ref 0.0–0.2)
Neutro Abs: 7.1 10*3/uL (ref 1.7–7.7)
Neutrophils Relative %: 56 %
PLATELETS: 321 10*3/uL (ref 150–400)
RBC: 4.61 MIL/uL (ref 3.87–5.11)
RDW: 12.9 % (ref 11.5–15.5)
WBC: 12.5 10*3/uL — ABNORMAL HIGH (ref 4.0–10.5)

## 2017-12-18 LAB — I-STAT TROPONIN, ED: Troponin i, poc: 0 ng/mL (ref 0.00–0.08)

## 2017-12-18 LAB — POC URINE PREG, ED: Preg Test, Ur: NEGATIVE

## 2017-12-18 MED ORDER — HYDROCODONE-ACETAMINOPHEN 5-325 MG PO TABS: 2.0000 | ORAL_TABLET | Freq: Once | ORAL | Status: DC

## 2017-12-18 MED ORDER — CYCLOBENZAPRINE HCL 5 MG PO TABS: 5.0000 mg | ORAL_TABLET | Freq: Three times a day (TID) | ORAL | 0 refills | Status: AC | PRN

## 2017-12-18 MED ORDER — KETOROLAC TROMETHAMINE 15 MG/ML IJ SOLN
15.0000 mg | Freq: Once | INTRAMUSCULAR | Status: AC
  Administered 2017-12-18: 15 mg via INTRAVENOUS
  Filled 2017-12-18: qty 1

## 2017-12-18 NOTE — Discharge Instructions (Addendum)
For your pain:  Take over-the-counter pain meds as needed: - Tylenol 1000 mg (2 tablets) every 6 hours as needed - Ibuprofen 600 mg (3 tablets of 200 mg) every 8 hours as needed  Take the muscle relaxant three times a day for 2 days, then as needed  IF you are trying to get pregnant, I would not take ibuprofen and would stick to Tylenol. Flexeril is actually the safest muscle relaxant in pregnancy (Category B), so this is okay (Robaxin, others are more dangerous).

## 2017-12-18 NOTE — ED Triage Notes (Signed)
Reports sudden onset of left sided chest pain that radiates into the left arm.  Endorses having the same pain a few months ago but didn't get it checked out.

## 2017-12-18 NOTE — ED Provider Notes (Signed)
[facility]   CSN: [id number] Arrival date & time: 12/18/17  0139     History   Chief Complaint Chief Complaint  Patient presents with  . Chest Pain    HPI Courtney Moses is a 27 y.o. female.  HPI   27 yo F with no significant PMHx here with left arm/chest pain. Pt reports that intermittently over the past one month, she has had episodes where she has an aching, positional pain in her left upper chest/shoulder area that hurts for 1-2 days at a time. Pain just recurred today and is more severe, so she presents for evaluation. She works in a factory placing labels on goods, but denies any increased work or regular heavy lifting. She reports pain is worse with movement, palpation, and lifting her arms up. She also reports tingling sensation in her ulnar hand occasionally when the pain is worse. No LE weakness or numbness. No fever or chills. No personal or family h/o early CAD. Denies any estrogen use or h/o DVT/PE.  Past Medical History:  Diagnosis Date  . Medical history non-contributory     There are no active problems to display for this patient.   Past Surgical History:  Procedure Laterality Date  . WISDOM TOOTH EXTRACTION       OB History    Gravida  1   Para  1   Term  1   Preterm      AB      Living  1     SAB      TAB      Ectopic      Multiple      Live Births  1            Home Medications    Prior to Admission medications   Medication Sig Start Date End Date Taking? Authorizing Provider  cyclobenzaprine (FLEXERIL) 5 MG tablet Take 1 tablet (5 mg total) by mouth 3 (three) times daily as needed for up to 7 days for muscle spasms. 12/18/17 12/25/17  Shaune PollackIsaacs, Lizet Kelso, MD  ibuprofen (ADVIL,MOTRIN) 600 MG tablet Take 1 tablet (600 mg total) by mouth every 6 (six) hours as needed for pain. Patient not taking: Reported on 01/17/2015 09/09/12   Elsie RaSmith, Nona, CNM    Family History No family  history on file.  Social History Social History   Tobacco Use  . Smoking status: Current Some Day Smoker  . Smokeless tobacco: Never Used  Substance Use Topics  . Alcohol use: No    Comment: occassionally  . Drug use: No     Allergies   Patient has no known allergies.   Review of Systems Review of Systems  Constitutional: Negative for chills, fatigue and fever.  HENT: Negative for congestion and rhinorrhea.   Eyes: Negative for visual disturbance.  Respiratory: Positive for chest tightness. Negative for cough, shortness of breath and wheezing.   Cardiovascular: Positive for chest pain. Negative for leg swelling.  Gastrointestinal: Negative for abdominal pain, diarrhea, nausea and vomiting.  Genitourinary: Negative for dysuria and flank pain.  Musculoskeletal: Positive for arthralgias and neck pain. Negative for neck stiffness.  Skin: Negative for rash and wound.  Allergic/Immunologic: Negative for immunocompromised state.  Neurological: Negative for syncope, weakness and headaches.  All other systems reviewed and are negative.    Physical Exam Updated Vital Signs BP 109/75   Pulse 64   Temp 98.3 F (36.8 C) (Oral)   Resp 16  Ht 5\' 4"  (1.626 m)   Wt 99.2 kg   SpO2 100%   BMI 37.54 kg/m   Physical Exam  Constitutional: She is oriented to person, place, and time. She appears well-developed and well-nourished. No distress.  HENT:  Head: Normocephalic and atraumatic.  Eyes: Conjunctivae are normal.  Neck: Neck supple.  TTP over left paraspinal cervical musculature, with positive Spurling's test and reproduction of pain/tingling with compression at neck.  Cardiovascular: Normal rate, regular rhythm and normal heart sounds. Exam reveals no friction rub.  No murmur heard. Pulmonary/Chest: Effort normal and breath sounds normal. No respiratory distress. She has no wheezes. She has no rales.  TTP over left anterior chest wall, superior trapezius. No redness,  swelling.  Abdominal: She exhibits no distension.  Musculoskeletal: She exhibits no edema.  Neurological: She is alert and oriented to person, place, and time. She exhibits normal muscle tone.  Strength 5/5 b/l UE including grip strength and interosseous muscles. Normal sensation to light touch.  Skin: Skin is warm. Capillary refill takes less than 2 seconds.  Psychiatric: She has a normal mood and affect.  Nursing note and vitals reviewed.    ED Treatments / Results  Labs (all labs ordered are listed, but only abnormal results are displayed) Labs Reviewed  CBC WITH DIFFERENTIAL/PLATELET - Abnormal; Notable for the following components:      Result Value   WBC 12.5 (*)    Lymphs Abs 4.3 (*)    All other components within normal limits  BASIC METABOLIC PANEL - Abnormal; Notable for the following components:   Calcium 8.8 (*)    All other components within normal limits  I-STAT BETA HCG BLOOD, ED ([facility], [facility], [facility] ONLY) - Abnormal; Notable for the following components:   I-stat hCG, quantitative 5.7 (*)    All other components within normal limits  I-STAT TROPONIN, ED  POC URINE PREG, ED    EKG EKG Interpretation  Date/Time:  Sunday December 18 2017 01:59:13 EST Ventricular Rate:  68 PR Interval:    QRS Duration: 95 QT Interval:  375 QTC Calculation: 399 R Axis:   72 Text Interpretation:  Sinus rhythm ST elev, probable normal early repol pattern No significant change since last tracing Confirmed by Shaune Pollack [phone]) on 12/18/2017 2:04:47 AM   Radiology Dg Chest 2 View  Result Date: 12/18/2017 CLINICAL DATA:  Initial evaluation for acute chest and upper back pain. EXAM: CHEST - 2 VIEW COMPARISON:  Prior CT from 08/02/2011. FINDINGS: The cardiac and mediastinal silhouettes are stable in size and contour, and remain within normal limits. The lungs are normally inflated. No airspace consolidation, pleural effusion, or pulmonary edema is identified. There is no pneumothorax. No  acute osseous abnormality identified. IMPRESSION: No active cardiopulmonary disease. Electronically Signed   By: Rise Mu M.D.   On: 12/18/2017 02:34   Dg Cervical Spine Complete  Result Date: 12/18/2017 CLINICAL DATA:  Initial evaluation for acute onset left-sided chest pain radiating into the left upper extremity. EXAM: CERVICAL SPINE - COMPLETE 4+ VIEW COMPARISON:  None. FINDINGS: There is no evidence of cervical spine fracture or prevertebral soft tissue swelling. Alignment is normal. No other significant bone abnormalities are identified. IMPRESSION: Negative cervical spine radiographs. Electronically Signed   By: Rise Mu M.D.   On: 12/18/2017 02:35    Procedures Procedures (including critical care time)  Medications Ordered in ED Medications  ketorolac (TORADOL) 15 MG/ML injection 15 mg (15 mg Intravenous Given 12/18/17 0201)     Initial  Impression / Assessment and Plan / ED Course  I have reviewed the triage vital signs and the nursing notes.  Pertinent labs & imaging results that were available during my care of the patient were reviewed by me and considered in my medical decision making (see chart for details).     27 yo F here w/ left upper arm pain. Pain is reproducible on exam with positive Spurling's test as well. I suspect this is MSK pain, likely cervical radiculopathy 2/2 her repetitive movement/lifting at work. DDx also includes costochondritis/chest wall pain though most of her sx relate to her upper arm. No signs of weakness, numbness, or neurological compromise. Her EKG is non-ischemic, with neg trop despite constant sx > 24 hours - doubt ACS. She is PERC neg, doubt PE and has no tachycardia, tachypnea, or hypoxia. CXR is clear. Will place on flexeril, advise outpt APAP/ibuprofen and monitoring of her stature/neck movements.   Final Clinical Impressions(s) / ED Diagnoses   Final diagnoses:  Atypical chest pain  Strain of neck muscle, initial  encounter    ED Discharge Orders         Ordered    cyclobenzaprine (FLEXERIL) 5 MG tablet  3 times daily PRN     12/18/17 0336           Shaune Pollack, MD 12/18/17 [phone]

## 2020-06-23 ENCOUNTER — Encounter (HOSPITAL_COMMUNITY): Payer: Self-pay

## 2020-06-23 ENCOUNTER — Ambulatory Visit (INDEPENDENT_AMBULATORY_CARE_PROVIDER_SITE_OTHER): Payer: Self-pay

## 2020-06-23 ENCOUNTER — Ambulatory Visit (HOSPITAL_COMMUNITY)
Admission: EM | Admit: 2020-06-23 | Discharge: 2020-06-23 | Disposition: A | Discharge status: Home / Self Care | Payer: Self-pay | Attending: Internal Medicine | Admitting: Internal Medicine

## 2020-06-23 DIAGNOSIS — J209 Acute bronchitis, unspecified: Secondary | ICD-10-CM

## 2020-06-23 DIAGNOSIS — R062 Wheezing: Secondary | ICD-10-CM

## 2020-06-23 DIAGNOSIS — R059 Cough, unspecified: Secondary | ICD-10-CM

## 2020-06-23 MED ORDER — BENZONATATE 100 MG PO CAPS: 100.0000 mg | ORAL_CAPSULE | Freq: Three times a day (TID) | ORAL | 0 refills | Status: DC

## 2020-06-23 MED ORDER — ALBUTEROL SULFATE HFA 108 (90 BASE) MCG/ACT IN AERS: 1.0000 | INHALATION_SPRAY | Freq: Four times a day (QID) | RESPIRATORY_TRACT | 0 refills | Status: DC | PRN

## 2020-06-23 MED ORDER — PREDNISONE 20 MG PO TABS: 20.0000 mg | ORAL_TABLET | Freq: Every day | ORAL | 0 refills | Status: AC

## 2020-06-23 NOTE — Discharge Instructions (Signed)
Use inhalers as prescribed Take steroids as prescribed We will call you with lab results if abnormal Return to urgent care if symptoms worsen. Your chest x-ray does not show pneumonia

## 2020-06-23 NOTE — ED Triage Notes (Signed)
Pt c/o a cough and wheezing X 1 week. Pt states she has been feeling weak, dizzy. She states when she sits still she feels well and when she moves around she feels SOB.

## 2020-06-24 NOTE — ED Provider Notes (Signed)
[facility]    CSN: [id number] Arrival date & time: 06/23/20  1024      History   Chief Complaint Chief Complaint  Patient presents with  . Cough  . Weakness  . Dizziness    HPI Courtney Moses is a 30 y.o. female comes to the urgent care with 1 week history of nonproductive cough and wheezing.  Symptoms started insidiously and has been persistent.  She describes generalized weakness and dizziness at the outset of her symptoms.  Her shortness of breath is mainly on exertion.  At rest patient denies any shortness of breath.  No fever or chills.  No nausea or vomiting.  No sick contacts.  Shortness of breath is associated with wheezing.  Cough is nonproductive.   HPI  Past Medical History:  Diagnosis Date  . Medical history non-contributory     There are no problems to display for this patient.   Past Surgical History:  Procedure Laterality Date  . WISDOM TOOTH EXTRACTION      OB History    Gravida  1   Para  1   Term  1   Preterm      AB      Living  1     SAB      IAB      Ectopic      Multiple      Live Births  1            Home Medications    Prior to Admission medications   Medication Sig Start Date End Date Taking? Authorizing Provider  albuterol (VENTOLIN HFA) 108 (90 Base) MCG/ACT inhaler Inhale 1-2 puffs into the lungs every 6 (six) hours as needed for wheezing or shortness of breath. 06/23/20  Yes Torii Royse, Britta Mccreedy, MD  benzonatate (TESSALON) 100 MG capsule Take 1 capsule (100 mg total) by mouth every 8 (eight) hours. 06/23/20  Yes Roniqua Kintz, Britta Mccreedy, MD  predniSONE (DELTASONE) 20 MG tablet Take 1 tablet (20 mg total) by mouth daily for 5 days. 06/23/20 06/28/20 Yes Dao Mearns, Britta Mccreedy, MD    Family History Family History  Problem Relation Age of Onset  . Hypertension Mother   . Diabetes Mother   . Healthy Father     Social History Social History   Tobacco Use  . Smoking status: Current Some Day Smoker  . Smokeless  tobacco: Never Used  Vaping Use  . Vaping Use: Never used  Substance Use Topics  . Alcohol use: No    Comment: occassionally  . Drug use: No     Allergies   Patient has no known allergies.   Review of Systems Review of Systems   Physical Exam Triage Vital Signs ED Triage Vitals  Enc Vitals Group     BP 06/23/20 1128 130/75     Pulse Rate 06/23/20 1128 67     Resp 06/23/20 1128 20     Temp 06/23/20 1128 99.3 F (37.4 C)     Temp Source 06/23/20 1128 Oral     SpO2 06/23/20 1128 99 %     Weight --      Height --      Head Circumference --      Peak Flow --      Pain Score 06/23/20 1125 0     Pain Loc --      Pain Edu? --      Excl. in GC? --    No data found.  Updated Vital Signs BP 130/75 (BP Location: Right Arm)   Pulse 67   Temp 99.3 F (37.4 C) (Oral)   Resp 20   LMP 06/14/2020 (Approximate)   SpO2 99%   Visual Acuity Right Eye Distance:   Left Eye Distance:   Bilateral Distance:    Right Eye Near:   Left Eye Near:    Bilateral Near:     Physical Exam Vitals and nursing note reviewed.  Constitutional:      General: She is not in acute distress.    Appearance: She is not ill-appearing.  HENT:     Right Ear: Tympanic membrane normal.     Left Ear: Tympanic membrane normal.  Cardiovascular:     Rate and Rhythm: Normal rate and regular rhythm.     Pulses: Normal pulses.     Heart sounds: Normal heart sounds.  Pulmonary:     Breath sounds: No stridor. Wheezing present. No rhonchi or rales.  Abdominal:     General: Bowel sounds are normal.     Palpations: Abdomen is soft.  Neurological:     Mental Status: She is alert.      UC Treatments / Results  Labs (all labs ordered are listed, but only abnormal results are displayed) Labs Reviewed - No data to display  EKG   Radiology DG Chest 2 View  Result Date: 06/23/2020 CLINICAL DATA:  Cough and wheezing for 1 week. Dizziness and weakness. EXAM: CHEST - 2 VIEW COMPARISON:  PA and lateral  chest 12/18/2017. FINDINGS: Lungs clear.  Heart size normal.  No pneumothorax or pleural fluid. IMPRESSION: Negative chest. Electronically Signed   By: Drusilla Kanner M.D.   On: 06/23/2020 12:01    Procedures Procedures (including critical care time)  Medications Ordered in UC Medications - No data to display  Initial Impression / Assessment and Plan / UC Course  I have reviewed the triage vital signs and the nursing notes.  Pertinent labs & imaging results that were available during my care of the patient were reviewed by me and considered in my medical decision making (see chart for details).     1.  Acute bronchitis with bronchospasm: Chest x-ray is negative for acute lung infiltrate Albuterol inhaler as needed for shortness of breath/chest tightness Tessalon Perles as needed for cough Prednisone 20 mg orally daily for 5 days Return precautions given Final Clinical Impressions(s) / UC Diagnoses   Final diagnoses:  Acute bronchitis with bronchospasm     Discharge Instructions     Use inhalers as prescribed Take steroids as prescribed We will call you with lab results if abnormal Return to urgent care if symptoms worsen. Your chest x-ray does not show pneumonia   ED Prescriptions    Medication Sig Dispense Auth. Provider   albuterol (VENTOLIN HFA) 108 (90 Base) MCG/ACT inhaler Inhale 1-2 puffs into the lungs every 6 (six) hours as needed for wheezing or shortness of breath. 18 g Karolynn Infantino, Britta Mccreedy, MD   benzonatate (TESSALON) 100 MG capsule Take 1 capsule (100 mg total) by mouth every 8 (eight) hours. 21 capsule Gicela Schwarting, Britta Mccreedy, MD   predniSONE (DELTASONE) 20 MG tablet Take 1 tablet (20 mg total) by mouth daily for 5 days. 5 tablet Laresha Bacorn, Britta Mccreedy, MD     PDMP not reviewed this encounter.   Merrilee Jansky, MD 06/24/20 1007

## 2021-05-18 ENCOUNTER — Encounter (HOSPITAL_COMMUNITY): Payer: Self-pay

## 2021-05-18 ENCOUNTER — Ambulatory Visit (HOSPITAL_COMMUNITY)
Admission: EM | Admit: 2021-05-18 | Discharge: 2021-05-18 | Disposition: A | Discharge status: Home / Self Care | Payer: Self-pay | Attending: Family Medicine | Admitting: Family Medicine

## 2021-05-18 DIAGNOSIS — N611 Abscess of the breast and nipple: Secondary | ICD-10-CM

## 2021-05-18 MED ORDER — SULFAMETHOXAZOLE-TRIMETHOPRIM 800-160 MG PO TABS: 1.0000 | ORAL_TABLET | Freq: Two times a day (BID) | ORAL | 0 refills | Status: AC

## 2021-05-18 NOTE — Discharge Instructions (Signed)
You were seen today for a small abscess, cellulitis of the left breat.  ?I have put you on an antibiotic to take twice/day x 7 days.  I recommend a warm compress to the area as well.  ?If the lump does not resolve completely, or you continue to feel something in that area, then please return for re-evaluation.  ? ?

## 2021-05-18 NOTE — ED Provider Notes (Signed)
?[facility] ? ? ? ?CSN: [id number] ?Arrival date & time: 05/18/21  0801 ? ? ?  ? ?History   ?Chief Complaint ?Chief Complaint  ?Patient presents with  ? Mass  ? ? ?HPI ?Courtney Moses is a 31 y.o. female.  ? ?Patient is here for a lump at the left breast.  Noted it 2 days ago.  Thought it was a pimple.  tried to pop it, but wouldn't pop.  Last night it was bigger, and still would not pop.  ?This morning it was not as big as last  night, but she can still feel it.   ?Only painful if she tries to squeeze it, but otherwise not painful.  ? ?Past Medical History:  ?Diagnosis Date  ? Medical history non-contributory   ? ? ?There are no problems to display for this patient. ? ? ?Past Surgical History:  ?Procedure Laterality Date  ? WISDOM TOOTH EXTRACTION    ? ? ?OB History   ? ? Gravida  ?1  ? Para  ?1  ? Term  ?1  ? Preterm  ?   ? AB  ?   ? Living  ?1  ?  ? ? SAB  ?   ? IAB  ?   ? Ectopic  ?   ? Multiple  ?   ? Live Births  ?1  ?   ?  ?  ? ? ? ?Home Medications   ? ?Prior to Admission medications   ?Medication Sig Start Date End Date Taking? Authorizing Provider  ?albuterol (VENTOLIN HFA) 108 (90 Base) MCG/ACT inhaler Inhale 1-2 puffs into the lungs every 6 (six) hours as needed for wheezing or shortness of breath. 06/23/20   LampteyBritta Mccreedy, MD  ?benzonatate (TESSALON) 100 MG capsule Take 1 capsule (100 mg total) by mouth every 8 (eight) hours. 06/23/20   Lamptey, Britta Mccreedy, MD  ? ? ?Family History ?Family History  ?Problem Relation Age of Onset  ? Hypertension Mother   ? Diabetes Mother   ? Healthy Father   ? ? ?Social History ?Social History  ? ?Tobacco Use  ? Smoking status: Some Days  ? Smokeless tobacco: Never  ?Vaping Use  ? Vaping Use: Never used  ?Substance Use Topics  ? Alcohol use: No  ?  Comment: occassionally  ? Drug use: No  ? ? ? ?Allergies   ?Patient has no known allergies. ? ? ?Review of Systems ?Review of Systems  ?Constitutional: Negative.   ?HENT: Negative.    ?Respiratory: Negative.     ?Cardiovascular: Negative.   ?Gastrointestinal: Negative.   ? ? ?Physical Exam ?Triage Vital Signs ?ED Triage Vitals  ?Enc Vitals Group  ?   BP 05/18/21 0817 128/82  ?   Pulse Rate 05/18/21 0817 67  ?   Resp 05/18/21 0817 18  ?   Temp 05/18/21 0817 98.6 ?F (37 ?C)  ?   Temp Source 05/18/21 0817 Oral  ?   SpO2 05/18/21 0817 96 %  ?   Weight --   ?   Height --   ?   Head Circumference --   ?   Peak Flow --   ?   Pain Score 05/18/21 0818 0  ?   Pain Loc --   ?   Pain Edu? --   ?   Excl. in GC? --   ? ?No data found. ? ?Updated Vital Signs ?BP 128/82 (BP Location: Left Arm)   Pulse 67  Temp 98.6 ?F (37 ?C) (Oral)   Resp 18   LMP 05/07/2021   SpO2 96%  ? ?Visual Acuity ?Right Eye Distance:   ?Left Eye Distance:   ?Bilateral Distance:   ? ?Right Eye Near:   ?Left Eye Near:    ?Bilateral Near:    ? ?Physical Exam ?Constitutional:   ?   Appearance: Normal appearance.  ?Chest:  ?   Comments: At the left medial breast is an area that appears slightly red, slightly warm.  Area feels slightly full, but no dominant mass or lump is noted per se ?Neurological:  ?   Mental Status: She is alert.  ? ? ? ?UC Treatments / Results  ?Labs ?(all labs ordered are listed, but only abnormal results are displayed) ?Labs Reviewed - No data to display ? ?EKG ? ? ?Radiology ?No results found. ? ?Procedures ?Procedures (including critical care time) ? ?Medications Ordered in UC ?Medications - No data to display ? ?Initial Impression / Assessment and Plan / UC Course  ?I have reviewed the triage vital signs and the nursing notes. ? ?Pertinent labs & imaging results that were available during my care of the patient were reviewed by me and considered in my medical decision making (see chart for details). ? ?  ?Final Clinical Impressions(s) / UC Diagnoses  ? ?Final diagnoses:  ?Abscess of left breast  ? ? ? ?Discharge Instructions   ? ?  ?You were seen today for a small abscess, cellulitis of the left breat.  ?I have put you on an antibiotic  to take twice/day x 7 days.  I recommend a warm compress to the area as well.  ?If the lump does not resolve completely, or you continue to feel something in that area, then please return for re-evaluation.  ? ? ? ? ?ED Prescriptions   ? ? Medication Sig Dispense Auth. Provider  ? sulfamethoxazole-trimethoprim (BACTRIM DS) 800-160 MG tablet Take 1 tablet by mouth 2 (two) times daily for 7 days. 14 tablet Jannifer Franklin, MD  ? ?  ? ?PDMP not reviewed this encounter. ?  ?Jannifer Franklin, MD ?05/18/21 0830 ? ?

## 2021-05-18 NOTE — ED Triage Notes (Signed)
Pt present lump on Left breast. Pt state the area is hard when you squeeze it and red  ?

## 2021-09-07 ENCOUNTER — Ambulatory Visit: Payer: Self-pay | Admitting: Family Medicine

## 2021-10-09 DIAGNOSIS — Z6841 Body Mass Index (BMI) 40.0 and over, adult: Secondary | ICD-10-CM | POA: Insufficient documentation

## 2021-10-09 DIAGNOSIS — F411 Generalized anxiety disorder: Secondary | ICD-10-CM | POA: Insufficient documentation

## 2021-10-09 DIAGNOSIS — R002 Palpitations: Secondary | ICD-10-CM | POA: Insufficient documentation

## 2021-10-12 DIAGNOSIS — E785 Hyperlipidemia, unspecified: Secondary | ICD-10-CM | POA: Insufficient documentation

## 2022-05-04 ENCOUNTER — Ambulatory Visit (HOSPITAL_COMMUNITY)
Admission: EM | Admit: 2022-05-04 | Discharge: 2022-05-04 | Disposition: A | Discharge status: Home / Self Care | Payer: Medicaid Other | Attending: Emergency Medicine | Admitting: Emergency Medicine

## 2022-05-04 ENCOUNTER — Encounter (HOSPITAL_COMMUNITY): Payer: Self-pay

## 2022-05-04 DIAGNOSIS — N760 Acute vaginitis: Secondary | ICD-10-CM | POA: Diagnosis present

## 2022-05-04 DIAGNOSIS — N61 Mastitis without abscess: Secondary | ICD-10-CM | POA: Diagnosis present

## 2022-05-04 DIAGNOSIS — Z113 Encounter for screening for infections with a predominantly sexual mode of transmission: Secondary | ICD-10-CM | POA: Insufficient documentation

## 2022-05-04 LAB — HIV ANTIBODY (ROUTINE TESTING W REFLEX): HIV Screen 4th Generation wRfx: NONREACTIVE

## 2022-05-04 MED ORDER — SULFAMETHOXAZOLE-TRIMETHOPRIM 800-160 MG PO TABS: 1.0000 | ORAL_TABLET | Freq: Two times a day (BID) | ORAL | 0 refills | Status: AC

## 2022-05-04 MED ORDER — FLUCONAZOLE 150 MG PO TABS: ORAL_TABLET | ORAL | 0 refills | Status: DC

## 2022-05-04 NOTE — Discharge Instructions (Addendum)
I have started you on antibiotics for your nipple infection. Please take them with food and as prescribed until finished. Do warm compresses 3x daily to the nipple.   We have swabbed you for yeast, BV and other STIs today as well as HIV and syphilis. We will call if there are any abnormalities and if you need treatment.  Your results will be available, positive or negative on MyChart.  Please start on fluconazole for yeast.  You can take a tablet today and then take another tablet within 72 hours if your vaginal itching and discomfort persist.  Please abstain from intercourse until all results are received.  Please return to clinic if you develop any new or concerning symptoms such as fever, nipple changes, warmth and streaking, or any worsening of infection.

## 2022-05-04 NOTE — ED Triage Notes (Signed)
Patient c/o a "pimple" on the left breast nipple that she noticed today. Patient states she squeezed it and  yellow/white pus came out."  Patient also c/o vaginal irritation x 3 days. Patient states she used a cottonelle wipe on her vagina and is now  hving avginal irritation.

## 2022-05-04 NOTE — ED Provider Notes (Signed)
[facility]    CSN: [id number] Arrival date & time: 05/04/22  1151      History   Chief Complaint Chief Complaint  Patient presents with   Breast Pain    HPI Courtney Moses is a 32 y.o. female.   Patient presents to clinic for complaints of skin issue on nipple and for vaginal irritation.   She noticed a pimple on her left breast today which she popped and had some white discharge.  Her left nipple is more firm than her right nipple today.  Denies pain, warmth or other concerns.  History of breast abscesses.  Patient was last sexually active on Thursday, menstrual cycle started Friday. She was at a friend's house a few days ago he did not have toilet paper, but had Cottonelle wipes.  After she went to the bathroom she used a cotton to wipe and immediately had some vaginal irritation. She has not noticed any change to her discharge or any odors, is on menses. She had been using tampons but switched to pads and now has labial irritation.   Would like STI screening.   The history is provided by the patient and medical records.    Past Medical History:  Diagnosis Date   Medical history non-contributory     There are no problems to display for this patient.   Past Surgical History:  Procedure Laterality Date   WISDOM TOOTH EXTRACTION      OB History     Gravida  1   Para  1   Term  1   Preterm      AB      Living  1      SAB      IAB      Ectopic      Multiple      Live Births  1            Home Medications    Prior to Admission medications   Medication Sig Start Date End Date Taking? Authorizing Provider  fluconazole (DIFLUCAN) 150 MG tablet Take 1 tablet today and then another in 72 hours if vaginal itching persists. 05/04/22  Yes Rinaldo Ratel, [place] N, FNP  sulfamethoxazole-trimethoprim (BACTRIM DS) 800-160 MG tablet Take 1 tablet by mouth 2 (two) times daily for 7 days. 05/04/22 05/11/22 Yes Grover Robinson, [place] N, FNP    Family  History Family History  Problem Relation Age of Onset   Hypertension Mother    Diabetes Mother    Healthy Father     Social History Social History   Tobacco Use   Smoking status: Former    Types: Cigarettes   Smokeless tobacco: Never  Vaping Use   Vaping Use: Never used  Substance Use Topics   Alcohol use: No    Comment: occassionally   Drug use: No     Allergies   Patient has no known allergies.   Review of Systems Review of Systems  Constitutional:  Negative for chills and fatigue.  Gastrointestinal:  Negative for abdominal pain.  Genitourinary:  Negative for dysuria, flank pain, frequency, hematuria, menstrual problem and vaginal discharge.     Physical Exam Triage Vital Signs ED Triage Vitals [05/04/22 1236]  Enc Vitals Group     BP 128/84     Pulse Rate 64     Resp 14     Temp 98.9 F (37.2 C)     Temp Source Oral     SpO2 97 %  Weight      Height      Head Circumference      Peak Flow      Pain Score 3     Pain Loc      Pain Edu?      Excl. in GC?    No data found.  Updated Vital Signs BP 128/84 (BP Location: Left Arm)   Pulse 64   Temp 98.9 F (37.2 C) (Oral)   Resp 14   LMP 04/30/2022   SpO2 97%   Visual Acuity Right Eye Distance:   Left Eye Distance:   Bilateral Distance:    Right Eye Near:   Left Eye Near:    Bilateral Near:     Physical Exam Vitals and nursing note reviewed.  Constitutional:      Appearance: Normal appearance.  HENT:     Head: Normocephalic and atraumatic.     Right Ear: External ear normal.     Left Ear: External ear normal.     Mouth/Throat:     Mouth: Mucous membranes are moist.  Eyes:     General: No scleral icterus.       Right eye: No discharge.        Left eye: No discharge.     Conjunctiva/sclera: Conjunctivae normal.  Cardiovascular:     Rate and Rhythm: Normal rate and regular rhythm.  Pulmonary:     Effort: Pulmonary effort is normal. No respiratory distress.  Chest:  Breasts:     Right: Normal.     Left: Normal.     Comments: Left nipple with firmness compared to right.  Skin:    General: Skin is warm and dry.     Capillary Refill: Capillary refill takes less than 2 seconds.  Neurological:     Mental Status: She is alert and oriented to person, place, and time.  Psychiatric:        Mood and Affect: Mood normal.        Behavior: Behavior normal. Behavior is cooperative.      UC Treatments / Results  Labs (all labs ordered are listed, but only abnormal results are displayed) Labs Reviewed  HIV ANTIBODY (ROUTINE TESTING W REFLEX)  RPR  CERVICOVAGINAL ANCILLARY ONLY    EKG   Radiology No results found.  Procedures Procedures (including critical care time)  Medications Ordered in UC Medications - No data to display  Initial Impression / Assessment and Plan / UC Course  I have reviewed the triage vital signs and the nursing notes.  Pertinent labs & imaging results that were available during my care of the patient were reviewed by me and considered in my medical decision making (see chart for details).  Vitals and triage reviewed, patient is hemodynamically stable. Left nipple with firmness, patient reports popping pimple earlier with yellow discharge, hx of breast abscesses. Will cover with bactrim for skin infection, encouraged warm compress. Without nipple inversion or skin changes.   Vaginal irritation and itching after using a wipe. Requesting STI testing as well. Will cover w/ diflucan for yeast, especially in the setting of being placed on abx for skin infection.  The urgent care will contact patient if positive and initiate appropriate treatment.  Patient verbalized understanding, return and follow-up precautions reviewed, no questions at this time.     Final Clinical Impressions(s) / UC Diagnoses   Final diagnoses:  Nipple infection in female  Vaginitis and vulvovaginitis  Screening examination for sexually transmitted disease  Discharge Instructions      I have started you on antibiotics for your nipple infection. Please take them with food and as prescribed until finished. Do warm compresses 3x daily to the nipple.   We have swabbed you for yeast, BV and other STIs today as well as HIV and syphilis. We will call if there are any abnormalities and if you need treatment.  Your results will be available, positive or negative on MyChart.  Please start on fluconazole for yeast.  You can take a tablet today and then take another tablet within 72 hours if your vaginal itching and discomfort persist.  Please abstain from intercourse until all results are received.  Please return to clinic if you develop any new or concerning symptoms such as fever, nipple changes, warmth and streaking, or any worsening of infection.      ED Prescriptions     Medication Sig Dispense Auth. Provider   sulfamethoxazole-trimethoprim (BACTRIM DS) 800-160 MG tablet Take 1 tablet by mouth 2 (two) times daily for 7 days. 14 tablet Rinaldo Ratel, [place] N, [place]   fluconazole (DIFLUCAN) 150 MG tablet Take 1 tablet today and then another in 72 hours if vaginal itching persists. 2 tablet Amadu Schlageter, [place] N, FNP      PDMP not reviewed this encounter.   Adrea Sherpa, [place] N, [place] 05/04/22 1322

## 2022-05-05 LAB — CERVICOVAGINAL ANCILLARY ONLY
Bacterial Vaginitis (gardnerella): POSITIVE — AB
Candida Glabrata: NEGATIVE
Candida Vaginitis: POSITIVE — AB
Chlamydia: NEGATIVE
Comment: NEGATIVE
Comment: NEGATIVE
Comment: NEGATIVE
Comment: NEGATIVE
Comment: NEGATIVE
Comment: NORMAL
Neisseria Gonorrhea: NEGATIVE
Trichomonas: NEGATIVE

## 2022-05-05 LAB — RPR: RPR Ser Ql: NONREACTIVE

## 2022-05-07 ENCOUNTER — Telehealth: Payer: Self-pay | Admitting: Emergency Medicine

## 2022-05-07 MED ORDER — METRONIDAZOLE 500 MG PO TABS
500.0000 mg | ORAL_TABLET | Freq: Two times a day (BID) | ORAL | 0 refills | Status: DC
Start: 2022-05-07 — End: 2023-03-08

## 2023-02-24 ENCOUNTER — Telehealth: Payer: Medicaid Other

## 2023-03-05 IMAGING — DX DG CHEST 2V
2 series · 2 of 2 positions shown · non-contrast
Comparison: PA and lateral chest 12/18/2017.

CLINICAL DATA: Cough and wheezing for 1 week. Dizziness and
weakness.

EXAM:
CHEST - 2 VIEW

[chest pa]
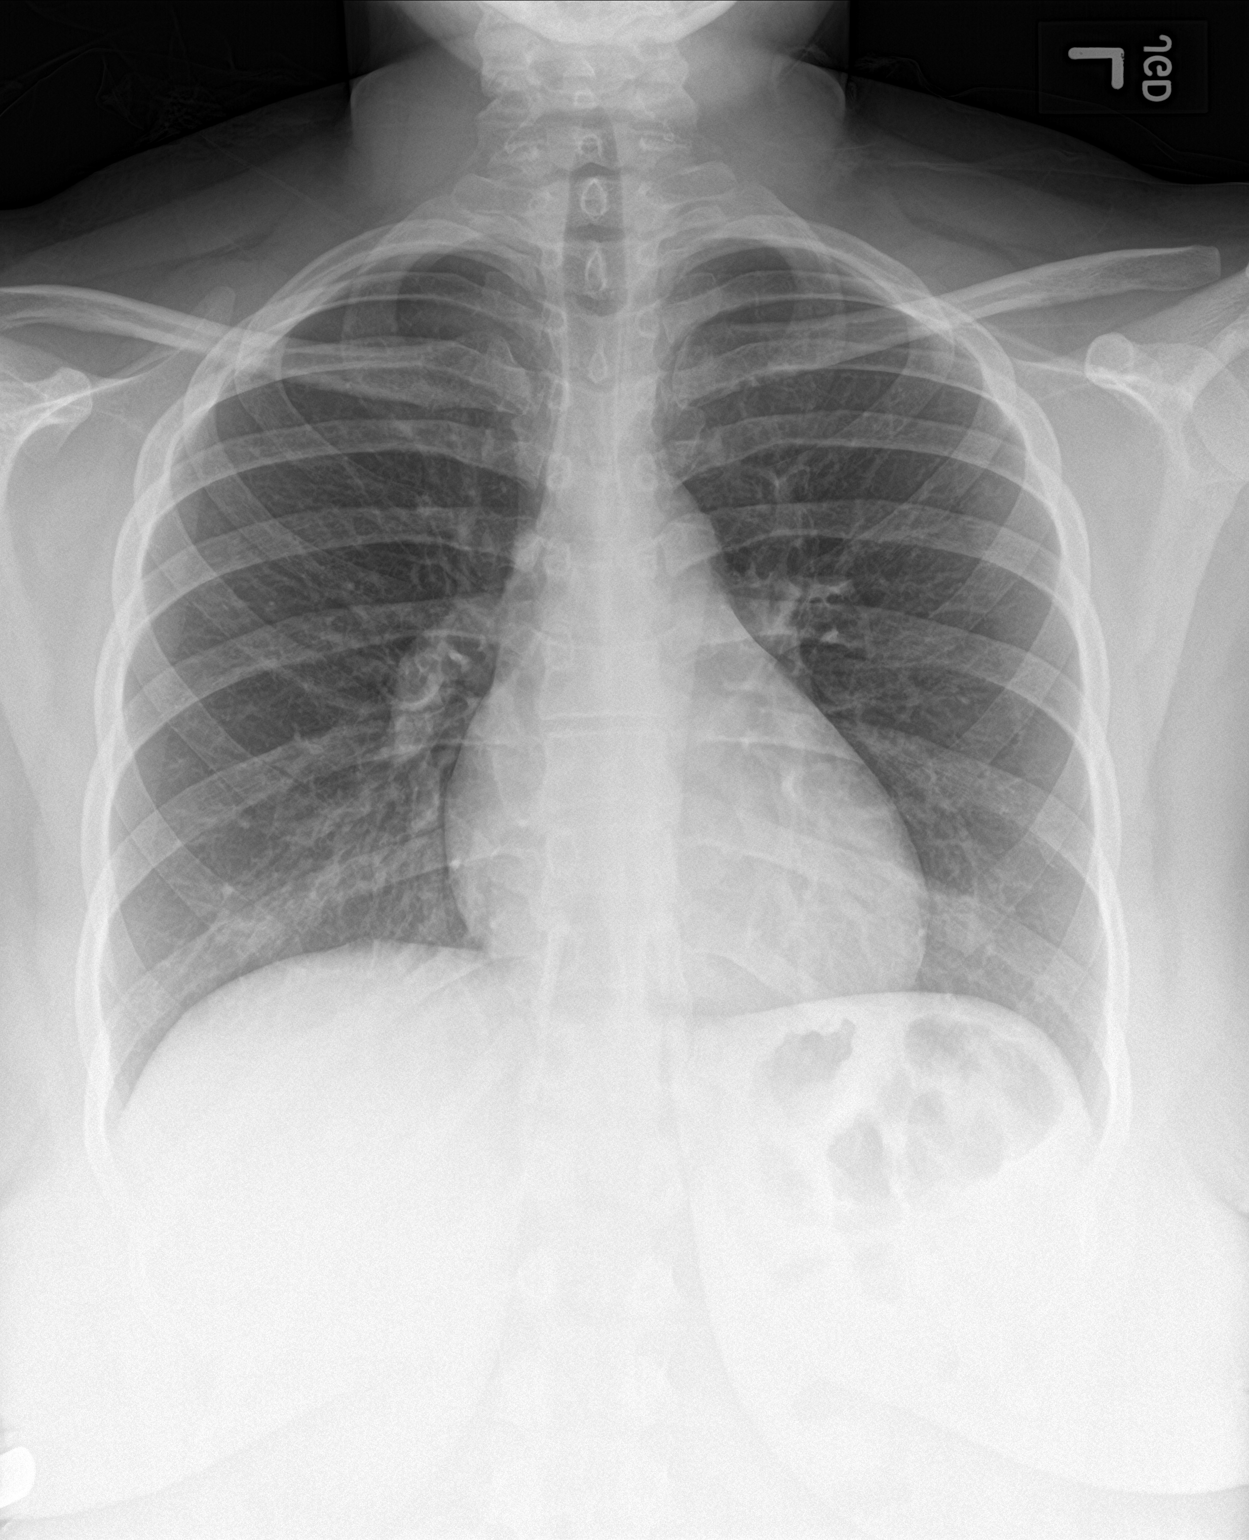

[chest lat]
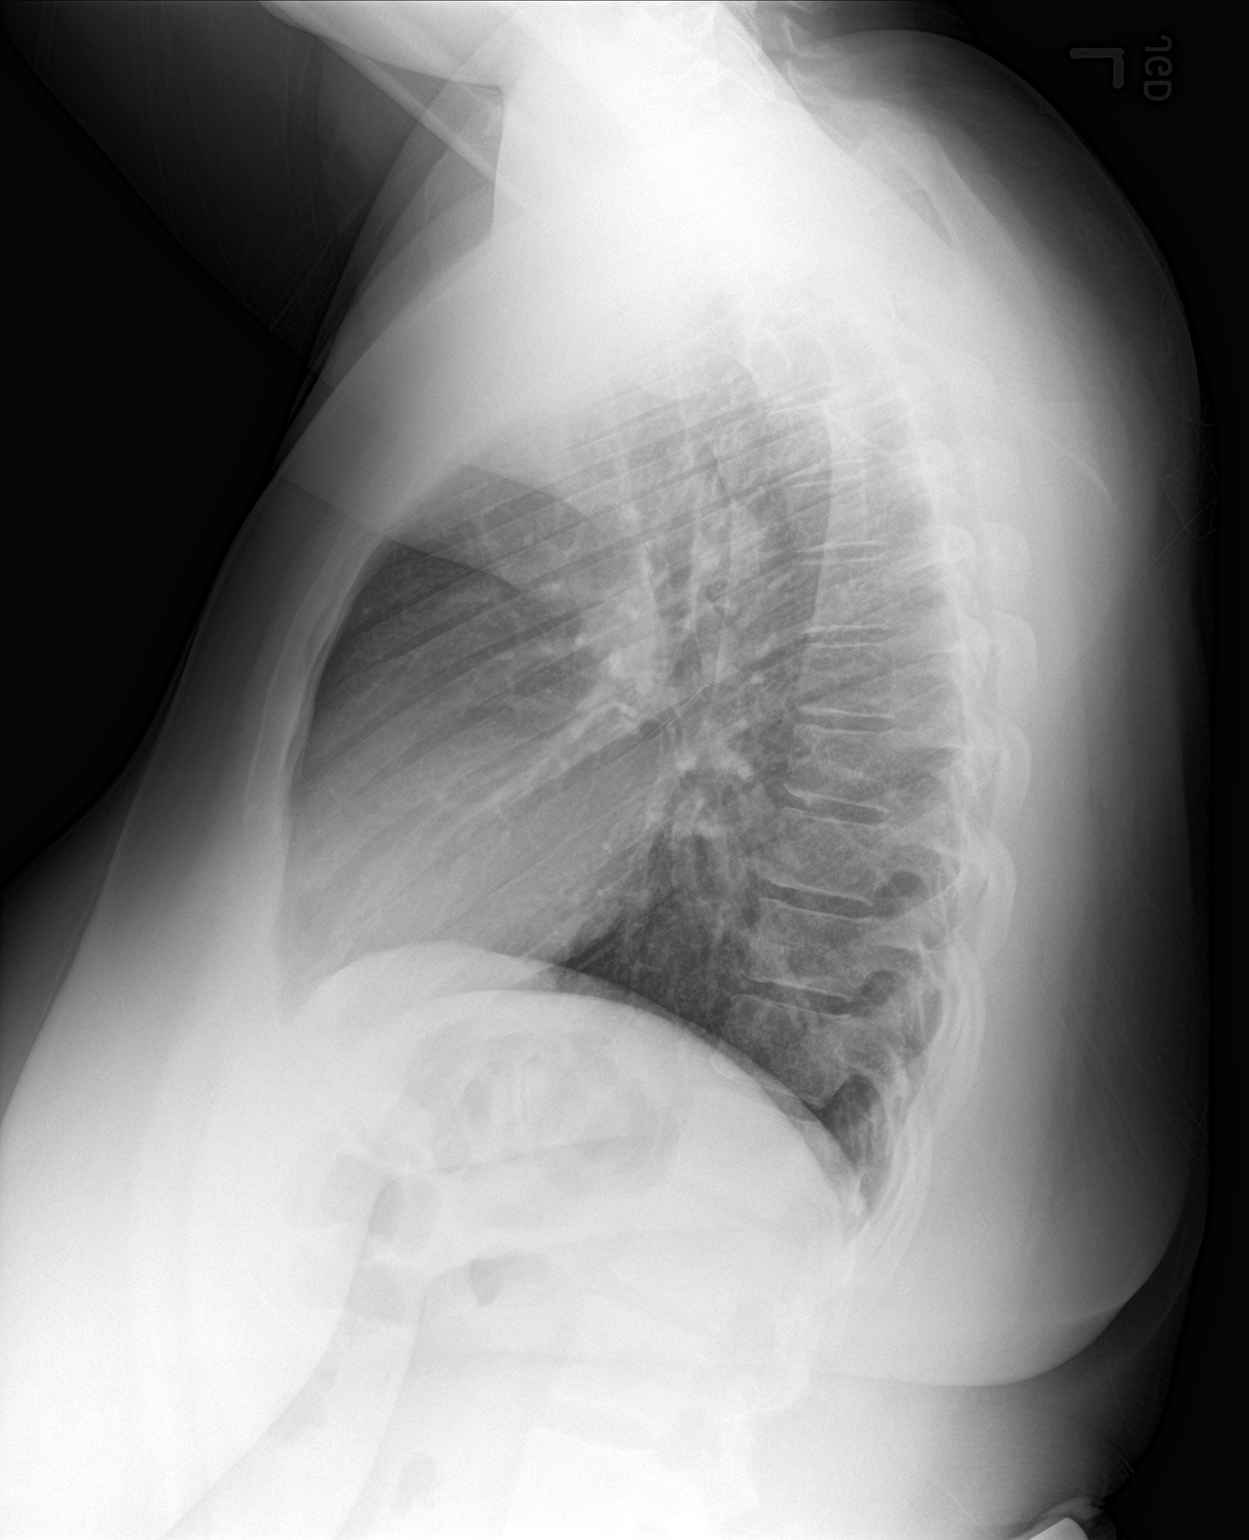

[2 of 2 positions shown; findings below may reference images not displayed]

FINDINGS: Lungs clear.  Heart size normal.  No pneumothorax or pleural fluid.
IMPRESSION: Negative chest.

## 2023-03-08 ENCOUNTER — Ambulatory Visit (HOSPITAL_COMMUNITY)
Admission: EM | Admit: 2023-03-08 | Discharge: 2023-03-08 | Disposition: A | Discharge status: Home / Self Care | Payer: Medicaid Other | Attending: Emergency Medicine | Admitting: Emergency Medicine

## 2023-03-08 DIAGNOSIS — N760 Acute vaginitis: Secondary | ICD-10-CM | POA: Diagnosis present

## 2023-03-08 DIAGNOSIS — Z113 Encounter for screening for infections with a predominantly sexual mode of transmission: Secondary | ICD-10-CM | POA: Diagnosis present

## 2023-03-08 LAB — POCT URINALYSIS DIP (MANUAL ENTRY)
Bilirubin, UA: NEGATIVE
Glucose, UA: NEGATIVE mg/dL
Ketones, POC UA: NEGATIVE mg/dL
Nitrite, UA: NEGATIVE
Protein Ur, POC: NEGATIVE mg/dL
Spec Grav, UA: 1.025 (ref 1.010–1.025)
Urobilinogen, UA: 0.2 U/dL
pH, UA: 5.5 (ref 5.0–8.0)

## 2023-03-08 LAB — POCT URINE PREGNANCY: Preg Test, Ur: NEGATIVE

## 2023-03-08 NOTE — ED Triage Notes (Signed)
 Pt c/o vaginal irritation x2 days after using a new soap. C/o milky white vaginal discharge with odor that started today. States also used scented tissues this weekend to vaginal area.

## 2023-03-08 NOTE — ED Provider Notes (Signed)
 [facility]    CSN: [id number] Arrival date & time: 03/08/23  1903    HISTORY   Chief Complaint  Patient presents with   SEXUALLY TRANSMITTED DISEASE   HPI Courtney Moses is a pleasant, 33 y.o. female who presents to urgent care today. Pt c/o vaginal irritation x2 days after using a new soap. C/o milky white vaginal discharge with odor that started today. States also used scented tissues this weekend to vaginal area.  Patient denies rash, suprapubic pain, genital lesion , burning during urination, increased frequency of urination, fever , body aches, chills, rigors, malaise, significant fatigue, and known STD exposure.   The history is provided by the patient.    Past Medical History:  Diagnosis Date   Medical history non-contributory    There are no active problems to display for this patient.  Past Surgical History:  Procedure Laterality Date   WISDOM TOOTH EXTRACTION     OB History     Gravida  1   Para  1   Term  1   Preterm      AB      Living  1      SAB      IAB      Ectopic      Multiple      Live Births  1          Home Medications    Prior to Admission medications   Medication Sig Start Date End Date Taking? Authorizing Provider  fluconazole (DIFLUCAN) 150 MG tablet Take 1 tablet today and then another in 72 hours if vaginal itching persists. 05/04/22   Garrison, [place] N, FNP  metroNIDAZOLE (FLAGYL) 500 MG tablet Take 1 tablet (500 mg total) by mouth 2 (two) times daily. 05/07/22   Lamptey, Britta Mccreedy, MD    Family History Family History  Problem Relation Age of Onset   Hypertension Mother    Diabetes Mother    Healthy Father    Social History Social History   Tobacco Use   Smoking status: Former    Types: Cigarettes   Smokeless tobacco: Never  Vaping Use   Vaping status: Never Used  Substance Use Topics   Alcohol use: No    Comment: occassionally   Drug use: No   Allergies   Patient has no known  allergies.  Review of Systems Review of Systems Pertinent findings revealed after performing a 14 point review of systems has been noted in the history of present illness.  Physical Exam Vital Signs BP 123/81 (BP Location: Right Arm)   Pulse 77   Temp 98 F (36.7 C) (Oral)   Resp 18   LMP 02/07/2023   SpO2 98%   No data found.  Physical Exam  Visual Acuity Right Eye Distance:   Left Eye Distance:   Bilateral Distance:    Right Eye Near:   Left Eye Near:    Bilateral Near:     UC Couse / Diagnostics / Procedures:     Radiology No results found.  Procedures Procedures (including critical care time) EKG  Pending results:  Labs Reviewed  POCT URINALYSIS DIP (MANUAL ENTRY) - Abnormal; Notable for the following components:      Result Value   Clarity, UA hazy (*)    Blood, UA trace-intact (*)    Leukocytes, UA Trace (*)    All other components within normal limits  POCT URINE PREGNANCY - Normal  CERVICOVAGINAL ANCILLARY ONLY  Medications Ordered in UC: Medications - No data to display  UC Diagnoses / Final Clinical Impressions(s)   I have reviewed the triage vital signs and the nursing notes.  Pertinent labs & imaging results that were available during my care of the patient were reviewed by me and considered in my medical decision making (see chart for details).    Final diagnoses:  Vaginitis and vulvovaginitis  Screening examination for STD (sexually transmitted disease)   STD screening was performed, patient advised that the results be posted to their MyChart and if any of the results are positive, they will be notified by phone, further treatment will be provided as indicated based on results of STD screening. Patient was advised to abstain from sexual intercourse until that they receive the results of their STD testing.  Patient was also advised to use condoms to protect themselves from STD exposure. Urinalysis today was normal. Urine pregnancy test  was negative.  Please see discharge instructions below for details of plan of care as provided to patient. ED Prescriptions   None    PDMP not reviewed this encounter.  Disposition Upon Discharge:  Condition: stable for discharge home  Patient presented with concern for an acute illness with associated systemic symptoms and significant discomfort requiring urgent management. In my opinion, this is a condition that a prudent lay person (someone who possesses an average knowledge of health and medicine) may potentially expect to result in complications if not addressed urgently such as respiratory distress, impairment of bodily function or dysfunction of bodily organs.   As such, the patient has been evaluated and assessed, work-up was performed and treatment was provided in alignment with urgent care protocols and evidence based medicine.  Patient/parent/caregiver has been advised that the patient may require follow up for further testing and/or treatment if the symptoms continue in spite of treatment, as clinically indicated and appropriate.  Routine symptom specific, illness specific and/or disease specific instructions were discussed with the patient and/or caregiver at length.  Prevention strategies for avoiding STD exposure were also discussed.  The patient will follow up with their current PCP if and as advised. If the patient does not currently have a PCP we will assist them in obtaining one.   The patient may need specialty follow up if the symptoms continue, in spite of conservative treatment and management, for further workup, evaluation, consultation and treatment as clinically indicated and appropriate.  Patient/parent/caregiver verbalized understanding and agreement of plan as discussed.  All questions were addressed during visit.  Please see discharge instructions below for further details of plan.    Discharge Instructions      The results of your vaginal swab test which  screens for BV, yeast, gonorrhea, chlamydia and trichomonas will be posted to your MyChart account once it is complete.  This typically takes 2 to 4 days.  Please abstain from sexual intercourse of any kind, vaginal, oral or anal, until you have received the results of your STD testing.      If any of your results are abnormal, you will receive a phone call regarding treatment.  Prescriptions, if any are needed, will be provided for you at your pharmacy.      Your urine pregnancy test today is negative.     Our point-of-care analysis of your urine sample today was normal and did not reveal any concern for urinary tract infection.     If you have not had complete resolution of your symptoms after completing any recommended  treatment or if your symptoms worsen, please return for repeat evaluation.     Thank you for visiting [facility] today.  We appreciate the opportunity to participate in your care.       This office note has been dictated using Teaching laboratory technician.  Unfortunately, this method of dictation can sometimes lead to typographical or grammatical errors.  I apologize for your inconvenience in advance if this occurs.  Please do not hesitate to reach out to me if clarification is needed.       Theadora Rama Scales, PA-C 03/09/23 1040

## 2023-03-08 NOTE — Discharge Instructions (Addendum)
 The results of your vaginal swab test which screens for BV, yeast, gonorrhea, chlamydia and trichomonas will be posted to your MyChart account once it is complete.  This typically takes 2 to 4 days.  Please abstain from sexual intercourse of any kind, vaginal, oral or anal, until you have received the results of your STD testing.      If any of your results are abnormal, you will receive a phone call regarding treatment.  Prescriptions, if any are needed, will be provided for you at your pharmacy.      Your urine pregnancy test today is negative.     Our point-of-care analysis of your urine sample today was normal and did not reveal any concern for urinary tract infection.     If you have not had complete resolution of your symptoms after completing any recommended treatment or if your symptoms worsen, please return for repeat evaluation.     Thank you for visiting [facility] today.  We appreciate the opportunity to participate in your care.

## 2023-03-10 ENCOUNTER — Telehealth (HOSPITAL_COMMUNITY): Payer: Self-pay

## 2023-03-10 LAB — CERVICOVAGINAL ANCILLARY ONLY
Bacterial Vaginitis (gardnerella): POSITIVE — AB
Candida Glabrata: NEGATIVE
Candida Vaginitis: POSITIVE — AB
Chlamydia: NEGATIVE
Comment: NEGATIVE
Comment: NEGATIVE
Comment: NEGATIVE
Comment: NEGATIVE
Comment: NEGATIVE
Comment: NORMAL
Neisseria Gonorrhea: NEGATIVE
Trichomonas: NEGATIVE

## 2023-03-10 MED ORDER — FLUCONAZOLE 150 MG PO TABS: 150.0000 mg | ORAL_TABLET | Freq: Once | ORAL | 0 refills | Status: AC

## 2023-03-10 MED ORDER — METRONIDAZOLE 500 MG PO TABS: 500.0000 mg | ORAL_TABLET | Freq: Two times a day (BID) | ORAL | 0 refills | Status: AC

## 2023-03-10 NOTE — Telephone Encounter (Signed)
 Per protocol, pt requires tx with Diflucan and metronidazole. Rx sent to pharmacy on file.

## 2023-06-28 ENCOUNTER — Telehealth: Admitting: Emergency Medicine

## 2023-06-28 DIAGNOSIS — N760 Acute vaginitis: Secondary | ICD-10-CM | POA: Diagnosis not present

## 2023-06-28 DIAGNOSIS — B9689 Other specified bacterial agents as the cause of diseases classified elsewhere: Secondary | ICD-10-CM | POA: Diagnosis not present

## 2023-06-28 MED ORDER — METRONIDAZOLE 0.75 % VA GEL: 1.0000 | Freq: Every day | VAGINAL | 0 refills | Status: AC

## 2023-06-28 MED ORDER — METRONIDAZOLE 500 MG PO TABS: 500.0000 mg | ORAL_TABLET | Freq: Three times a day (TID) | ORAL | 0 refills | Status: DC

## 2023-06-28 NOTE — Progress Notes (Signed)
 Virtual Visit Consent   Courtney Moses, you are scheduled for a virtual visit with a [facility] provider today. Just as with appointments in the office, your consent must be obtained to participate. Your consent will be active for this visit and any virtual visit you may have with one of our providers in the next 365 days. If you have a MyChart account, a copy of this consent can be sent to you electronically.  As this is a virtual visit, video technology does not allow for your provider to perform a traditional examination. This may limit your provider's ability to fully assess your condition. If your provider identifies any concerns that need to be evaluated in person or the need to arrange testing (such as labs, EKG, etc.), we will make arrangements to do so. Although advances in technology are sophisticated, we cannot ensure that it will always work on either your end or our end. If the connection with a video visit is poor, the visit may have to be switched to a telephone visit. With either a video or telephone visit, we are not always able to ensure that we have a secure connection.  By engaging in this virtual visit, you consent to the provision of healthcare and authorize for your insurance to be billed (if applicable) for the services provided during this visit. Depending on your insurance coverage, you may receive a charge related to this service.  I need to obtain your verbal consent now. Are you willing to proceed with your visit today? Courtney Moses has provided verbal consent on 06/28/2023 for a virtual visit (video or telephone). Blinda Burger, NP  Date: 06/28/2023 9:10 AM   Virtual Visit via Video Note   I, Blinda Burger, connected with  Courtney Moses  ([id number], 1990-10-28) on 06/28/23 at  9:00 AM EDT by a video-enabled telemedicine application and verified that I am speaking with the correct person using two identifiers.  Location: Patient: Virtual Visit Location  Patient: Other: outside in [place] Provider: Virtual Visit Location Provider: Home Office   I discussed the limitations of evaluation and management by telemedicine and the availability of in person appointments. The patient expressed understanding and agreed to proceed.    History of Present Illness: Courtney Moses is a 33 y.o. who identifies as a female who was assigned female at birth, and is being seen today for bacterial vaginosis. Dealing with it for years on and off. Happens every 3-4 months. Last occurred in Feb, felt completely well after that treatment. Sx started a week ago. Discharge is a milky white and smells bad and comes and goes. Does not douche or use scented products. Does not notice a pattern with her periods.   HPI: HPI  Problems: There are no active problems to display for this patient.   Allergies: No Known Allergies Medications:  Current Outpatient Medications:    metroNIDAZOLE  (METROGEL ) 0.75 % vaginal gel, Place 1 Applicatorful vaginally at bedtime for 7 days., Disp: 70 g, Rfl: 0  Observations/Objective: Patient is well-developed, well-nourished in no acute distress.  Resting comfortably outside in [place].  Head is normocephalic, atraumatic.  No labored breathing.  Speech is clear and coherent with logical content.  Patient is alert and oriented at baseline.    Assessment and Plan: 1. BV (bacterial vaginosis) (Primary)  Consider having partner treated since this is recurrent for her  Follow Up Instructions: I discussed the assessment and treatment plan with the patient. The patient was provided  an opportunity to ask questions and all were answered. The patient agreed with the plan and demonstrated an understanding of the instructions.  A copy of instructions were sent to the patient via MyChart unless otherwise noted below.    The patient was advised to call back or seek an in-person evaluation if the symptoms worsen or if the condition fails to  improve as anticipated.    Blinda Burger, NP

## 2023-06-28 NOTE — Patient Instructions (Signed)
  Courtney Moses, thank you for joining Blinda Burger, NP for today's virtual visit.  While this provider is not your primary care provider (PCP), if your PCP is located in our provider database this encounter information will be shared with them immediately following your visit.   A [facility] MyChart account gives you access to today's visit and all your visits, tests, and labs performed at [facility] " click here if you don't have a [facility] MyChart account or go to [email or web address]  Consent: (Patient) Courtney Moses provided verbal consent for this virtual visit at the beginning of the encounter.  Current Medications:  Current Outpatient Medications:    metroNIDAZOLE  (METROGEL ) 0.75 % vaginal gel, Place 1 Applicatorful vaginally at bedtime for 7 days., Disp: 70 g, Rfl: 0   Medications ordered in this encounter:  Meds ordered this encounter  Medications   DISCONTD: metroNIDAZOLE  (FLAGYL ) 500 MG tablet    Sig: Take 1 tablet (500 mg total) by mouth 3 (three) times daily for 7 days.    Dispense:  21 tablet    Refill:  0   metroNIDAZOLE  (METROGEL ) 0.75 % vaginal gel    Sig: Place 1 Applicatorful vaginally at bedtime for 7 days.    Dispense:  70 g    Refill:  0     *If you need refills on other medications prior to your next appointment, please contact your pharmacy*  Follow-Up: Call back or seek an in-person evaluation if the symptoms worsen or if the condition fails to improve as anticipated.  [facility] [phone]  Other Instructions Consider talking to your partner about having him treated. Bacterial vaginosis is NOT a sexually transmitted disease but we are seeing some research that shows having partners take the treatment too reduces BV in the woman.    If you have been instructed to have an in-person evaluation today at a local Urgent Care facility, please use the link below. It will take you to a list of all of our available  [facility], including address, phone number and hours of operation. Please do not delay care.  [facility]  If you or a family member do not have a primary care provider, use the link below to schedule a visit and establish care. When you choose a [facility] primary care physician or advanced practice provider, you gain a long-term partner in health. Find a Primary Care Provider  Learn more about [facility]'s in-office and virtual care options: [facility] - Get Care Now

## 2023-08-04 ENCOUNTER — Telehealth: Admitting: Physician Assistant

## 2023-08-04 DIAGNOSIS — B9689 Other specified bacterial agents as the cause of diseases classified elsewhere: Secondary | ICD-10-CM | POA: Diagnosis not present

## 2023-08-04 DIAGNOSIS — N760 Acute vaginitis: Secondary | ICD-10-CM

## 2023-08-04 MED ORDER — FLUCONAZOLE 150 MG PO TABS: ORAL_TABLET | ORAL | 0 refills | Status: AC

## 2023-08-04 MED ORDER — METRONIDAZOLE 0.75 % VA GEL
1.0000 | Freq: Every day | VAGINAL | 0 refills | Status: AC
Start: 2023-08-04 — End: 2023-08-11

## 2023-08-04 NOTE — Progress Notes (Signed)
 Follow-up question from visit earlier. No charge.

## 2023-08-04 NOTE — Patient Instructions (Signed)
 Courtney Moses, thank you for joining Elsie Velma Lunger, PA-C for today's virtual visit.  While this provider is not your primary care provider (PCP), if your PCP is located in our provider database this encounter information will be shared with them immediately following your visit.   A [facility] MyChart account gives you access to today's visit and all your visits, tests, and labs performed at [facility]  click here if you don't have a [facility] MyChart account or go to [email or web address]  Consent: (Patient) Courtney Moses provided verbal consent for this virtual visit at the beginning of the encounter.  Current Medications:  Current Outpatient Medications:    metroNIDAZOLE  (METROGEL ) 0.75 % vaginal gel, Place 1 Applicatorful vaginally at bedtime for 7 days., Disp: 70 g, Rfl: 0   Medications ordered in this encounter:  Meds ordered this encounter  Medications   metroNIDAZOLE  (METROGEL ) 0.75 % vaginal gel    Sig: Place 1 Applicatorful vaginally at bedtime for 7 days.    Dispense:  70 g    Refill:  0    Supervising Provider:   BLAISE ALEENE KIDD [[id number]]     *If you need refills on other medications prior to your next appointment, please contact your pharmacy*  Follow-Up: Call back or seek an in-person evaluation if the symptoms worsen or if the condition fails to improve as anticipated.  [facility] [phone]  Other Instructions Vaginal Infection (Bacterial Vaginosis): What to Know  Bacterial vaginosis is an infection of the vagina. It happens when the balance of normal germs (bacteria) in the vagina changes. If you don't get treated, it can make it easier for you to get other infections from sex. These are called sexually transmitted infections (STIs). If you're pregnant, you need to get treated right away. This infection can cause a baby to be born early or at a low birth weight. What are the causes? This infection happens when  too many harmful germs grow in the vagina. You can't get this infection from toilet seats, bedsheets, swimming pools, or things that touch your vagina. What increases the risk? Having sex with a new person or more than one person. Having sex without protection. Douching. Having an intrauterine device (IUD). Smoking. Using drugs or drinking alcohol. These can lead you to do risky things. Taking certain antibiotics. Being pregnant. What are the signs or symptoms? Some females have no symptoms. Symptoms may include: A gray or white discharge from your vagina. It can be watery or foamy. A fishy smell. This can happen after sex or during your menstrual period. Itching in and around your vagina. Burning or pain when you pee. How is this treated? This infection is treated with antibiotics. These may be given to you as: A pill. A cream for your vagina. A medicine that you put into your vagina (suppository). If the infection comes back, you may need more antibiotics. Follow these instructions at home: Medicines Take your medicines as told. Take or use your antibiotics as told. Do not stop using them even if you start to feel better. General instructions If the person you have sex with is a female, tell her that you have this infection. She will need to follow up with her doctor. Female partners don't need to be treated. Do not have sex until you finish treatment. Drink more fluids as told. Keep your vagina and butt clean. Wash these areas with warm water each day. Wipe from front to back after you  poop. If you're breastfeeding a baby, talk to your doctor if you should keep doing so during treatment. How is this prevented? Self-care Do not douche. Do not use deodorant sprays on your vagina. Wear cotton underwear. Do not wear tight pants and pantyhose, especially in the summer. Safe sex Use condoms the correct way and every time you have sex. Use dental dams to protect yourself during  oral sex. Limit how many people you have sex with. Get tested for STIs. The person you have sex with should also get tested. Drugs and alcohol Do not smoke, vape, or use nicotine or tobacco. Do not use drugs. Limit the amount of alcohol you drink because it can lead you to do risky things. Where to find more information To learn more: Go to TonerPromos.no. Click Health Topics A-Z. Type bacterial vaginosis in the search bar. American Sexual Health Association (ASHA): ashasexualhealth.org U.S. Department of Health and [employer], Office on Women's Health: TravelLesson.ca Contact a doctor if: Your symptoms don't get better, even after treatment. You have more discharge or pain when you pee. You have a fever or chills. You have pain in your belly or in the area between your hips. You have pain during sex. You bleed from your vagina between menstrual periods. This information is not intended to replace advice given to you by your health care provider. Make sure you discuss any questions you have with your health care provider. Document Revised: 06/23/2022 Document Reviewed: 06/23/2022 Elsevier Patient Education  2024 Elsevier Inc.   If you have been instructed to have an in-person evaluation today at a local Urgent Care facility, please use the link below. It will take you to a list of all of our available [facility], including address, phone number and hours of operation. Please do not delay care.  [facility]  If you or a family member do not have a primary care provider, use the link below to schedule a visit and establish care. When you choose a [facility] primary care physician or advanced practice provider, you gain a long-term partner in health. Find a Primary Care Provider  Learn more about [facility]'s in-office and virtual care options: [facility] - Get Care Now

## 2023-08-04 NOTE — Progress Notes (Signed)
 Virtual Visit Consent   Courtney Moses, you are scheduled for a virtual visit with a [facility] provider today. Just as with appointments in the office, your consent must be obtained to participate. Your consent will be active for this visit and any virtual visit you may have with one of our providers in the next 365 days. If you have a MyChart account, a copy of this consent can be sent to you electronically.  As this is a virtual visit, video technology does not allow for your provider to perform a traditional examination. This may limit your provider's ability to fully assess your condition. If your provider identifies any concerns that need to be evaluated in person or the need to arrange testing (such as labs, EKG, etc.), we will make arrangements to do so. Although advances in technology are sophisticated, we cannot ensure that it will always work on either your end or our end. If the connection with a video visit is poor, the visit may have to be switched to a telephone visit. With either a video or telephone visit, we are not always able to ensure that we have a secure connection.  By engaging in this virtual visit, you consent to the provision of healthcare and authorize for your insurance to be billed (if applicable) for the services provided during this visit. Depending on your insurance coverage, you may receive a charge related to this service.  I need to obtain your verbal consent now. Are you willing to proceed with your visit today? Courtney Moses has provided verbal consent on 08/04/2023 for a virtual visit (video or telephone). Elsie Velma Lunger, [place]  Date: 08/04/2023 5:15 PM   Virtual Visit via Video Note   I, Elsie Velma Lunger, connected with  Courtney Moses  ([id number], [birth year]) on 08/04/23 at  5:00 PM EDT by a video-enabled telemedicine application and verified that I am speaking with the correct person using two identifiers.  Location: Patient: Virtual Visit  Location Patient: Home Provider: Virtual Visit Location Provider: Home Office   I discussed the limitations of evaluation and management by telemedicine and the availability of in person appointments. The patient expressed understanding and agreed to proceed.    History of Present Illness: Courtney Moses is a 33 y.o. who identifies as a female who was assigned female at birth, and is being seen today for possible bacterial vaginosis. Notes that she was seen a month ago for this issue and was prescribed Metrogel  but never picked up due to cost. Notes some continued vaginal discharge with odor and irritation. Denies pelvic pain, fever, chills, abdominal pain.   HPI: HPI  Problems: There are no active problems to display for this patient.   Allergies: No Known Allergies Medications:  Current Outpatient Medications:    metroNIDAZOLE  (METROGEL ) 0.75 % vaginal gel, Place 1 Applicatorful vaginally at bedtime for 7 days., Disp: 70 g, Rfl: 0  Observations/Objective: Patient is well-developed, well-nourished in no acute distress.  Resting comfortably at home.  Head is normocephalic, atraumatic.  No labored breathing. Speech is clear and coherent with logical content.  Patient is alert and oriented at baseline.    Assessment and Plan: 1. Bacterial vaginosis (Primary) - metroNIDAZOLE  (METROGEL ) 0.75 % vaginal gel; Place 1 Applicatorful vaginally at bedtime for 7 days.  Dispense: 70 g; Refill: 0  Medication resent to the pharmacy for her to pick up and take as directed.  Follow Up Instructions: I discussed the assessment and treatment plan with the  patient. The patient was provided an opportunity to ask questions and all were answered. The patient agreed with the plan and demonstrated an understanding of the instructions.  A copy of instructions were sent to the patient via MyChart unless otherwise noted below.   The patient was advised to call back or seek an in-person evaluation if the  symptoms worsen or if the condition fails to improve as anticipated.    Elsie Velma Lunger, PA-C

## 2023-08-31 ENCOUNTER — Telehealth: Admitting: Nurse Practitioner

## 2023-08-31 DIAGNOSIS — B009 Herpesviral infection, unspecified: Secondary | ICD-10-CM

## 2023-08-31 NOTE — Progress Notes (Signed)
 Virtual Visit Consent   Courtney Moses, you are scheduled for a virtual visit with a [facility] provider today. Just as with appointments in the office, your consent must be obtained to participate. Your consent will be active for this visit and any virtual visit you may have with one of our providers in the next 365 days. If you have a MyChart account, a copy of this consent can be sent to you electronically.  As this is a virtual visit, video technology does not allow for your provider to perform a traditional examination. This may limit your provider's ability to fully assess your condition. If your provider identifies any concerns that need to be evaluated in person or the need to arrange testing (such as labs, EKG, etc.), we will make arrangements to do so. Although advances in technology are sophisticated, we cannot ensure that it will always work on either your end or our end. If the connection with a video visit is poor, the visit may have to be switched to a telephone visit. With either a video or telephone visit, we are not always able to ensure that we have a secure connection.  By engaging in this virtual visit, you consent to the provision of healthcare and authorize for your insurance to be billed (if applicable) for the services provided during this visit. Depending on your insurance coverage, you may receive a charge related to this service.  I need to obtain your verbal consent now. Are you willing to proceed with your visit today? Courtney Moses has provided verbal consent on 08/31/2023 for a virtual visit (video or telephone). Lauraine Kitty, FNP  Date: 08/31/2023 5:25 PM   Virtual Visit via Video Note   I, Lauraine Kitty, connected with  Courtney Moses  ([id number], [birth year]) on 08/31/23 at  5:30 PM EDT by a video-enabled telemedicine application and verified that I am speaking with the correct person using two identifiers.  Location: Patient: Virtual Visit Location Patient:  Home Provider: Virtual Visit Location Provider: Home Office   I discussed the limitations of evaluation and management by telemedicine and the availability of in person appointments. The patient expressed understanding and agreed to proceed.    History of Present Illness: Courtney Moses is a 32 y.o. who identifies as a female who was assigned female at birth, and is being seen today for concerns over herpes outbreaks   She gets an outbreak on average twice a year.   She does not currently have an OBGYN she does have a primary care provider   She was diagnosed with HSV-2 years ago   She has questions today about if she should go on a daily preventative therapy   Problems: There are no active problems to display for this patient.   Allergies: No Known Allergies Medications:  Current Outpatient Medications:    fluconazole (DIFLUCAN) 150 MG tablet, Take 1 tablet PO once. Repeat in 3 days if needed., Disp: 2 tablet, Rfl: 0  Observations/Objective: Patient is well-developed, well-nourished in no acute distress.  Resting comfortably  at home.  Head is normocephalic, atraumatic.  No labored breathing.  Speech is clear and coherent with logical content.  Patient is alert and oriented at baseline.    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  Encouraged to follow up with primary care for ongoing conversation  Encouraged back up method  when sexually active      Follow Up Instructions: I discussed the assessment and treatment plan with the patient. The patient was provided an opportunity to ask questions and all were answered. The patient agreed with the plan and demonstrated an understanding of the instructions.  A copy of instructions were sent to the patient via MyChart unless otherwise noted below.    The patient was advised to call back or  seek an in-person evaluation if the symptoms worsen or if the condition fails to improve as anticipated.    Lauraine Kitty, FNP

## 2023-11-25 ENCOUNTER — Ambulatory Visit
Admission: EM | Admit: 2023-11-25 | Discharge: 2023-11-25 | Disposition: A | Discharge status: Home / Self Care | Attending: Family Medicine | Admitting: Family Medicine

## 2023-11-25 DIAGNOSIS — Z113 Encounter for screening for infections with a predominantly sexual mode of transmission: Secondary | ICD-10-CM | POA: Insufficient documentation

## 2023-11-25 NOTE — ED Triage Notes (Signed)
 Pt requesting STD testing-denies sx/known exposure-NAD-steady gait

## 2023-11-25 NOTE — Discharge Instructions (Signed)
 Your blood test results should be available tomorrow and Monday for the vaginal swab.  Our results team will let you know if you need any kind of treatment for any positive test results.

## 2023-11-25 NOTE — ED Provider Notes (Signed)
  [facility]  Note:  This document was prepared using Conservation officer, historic buildings and may include unintentional dictation errors.  MRN: [id number] DOB: 08-09-1990  Subjective:   Courtney Moses is a 33 y.o. female presenting for STI check.  Denies fever, n/v, abdominal pain, pelvic pain, rashes, dysuria, urinary frequency, hematuria, vaginal discharge.  No known exposures.  No current facility-administered medications for this encounter.  Current Outpatient Medications:    fluconazole  (DIFLUCAN ) 150 MG tablet, Take 1 tablet PO once. Repeat in 3 days if needed., Disp: 2 tablet, Rfl: 0   No Known Allergies  Past Medical History:  Diagnosis Date   Medical history non-contributory      Past Surgical History:  Procedure Laterality Date   WISDOM TOOTH EXTRACTION      Family History  Problem Relation Age of Onset   Hypertension Mother    Diabetes Mother    Healthy Father     Social History   Tobacco Use   Smoking status: Former    Types: Cigarettes   Smokeless tobacco: Never  Vaping Use   Vaping status: Never Used  Substance Use Topics   Alcohol use: Yes    Comment: occassionally   Drug use: No    ROS   Objective:   Vitals: BP (!) 145/83 (BP Location: Right Arm)   Pulse 80   Temp 99.1 F (37.3 C) (Oral)   Resp 20   SpO2 96%   Physical Exam Constitutional:      General: She is not in acute distress.    Appearance: Normal appearance. She is well-developed. She is not ill-appearing, toxic-appearing or diaphoretic.  HENT:     Head: Normocephalic and atraumatic.     Nose: Nose normal.     Mouth/Throat:     Mouth: Mucous membranes are moist.  Eyes:     General: No scleral icterus.       Right eye: No discharge.        Left eye: No discharge.     Extraocular Movements: Extraocular movements intact.  Cardiovascular:     Rate and Rhythm: Normal rate.  Pulmonary:     Effort: Pulmonary effort is normal.  Skin:    General:  Skin is warm and dry.  Neurological:     General: No focal deficit present.     Mental Status: She is alert and oriented to person, place, and time.  Psychiatric:        Mood and Affect: Mood normal.        Behavior: Behavior normal.     Assessment and Plan :   PDMP not reviewed this encounter.  1. Screen for STD (sexually transmitted disease)    STI check pending, will treat as appropriate based off of results.   Christopher Savannah, PA-C 11/25/23 1406

## 2023-11-26 LAB — HIV ANTIBODY (ROUTINE TESTING W REFLEX): HIV Screen 4th Generation wRfx: NONREACTIVE

## 2023-11-26 LAB — RPR: RPR Ser Ql: NONREACTIVE

## 2023-11-28 LAB — CERVICOVAGINAL ANCILLARY ONLY
Chlamydia: NEGATIVE
Comment: NEGATIVE
Comment: NEGATIVE
Comment: NORMAL
Neisseria Gonorrhea: NEGATIVE
Trichomonas: NEGATIVE

## 2023-11-30 ENCOUNTER — Ambulatory Visit: Payer: Self-pay | Admitting: Urgent Care

## 2023-11-30 LAB — MISC LABCORP TEST (SEND OUT): Labcorp test code: 180076

## 2023-12-01 MED ORDER — DOXYCYCLINE HYCLATE 100 MG PO TABS: 100.0000 mg | ORAL_TABLET | Freq: Two times a day (BID) | ORAL | 0 refills | Status: AC

## 2023-12-07 MED ORDER — ONDANSETRON HCL 4 MG PO TABS: 4.0000 mg | ORAL_TABLET | Freq: Three times a day (TID) | ORAL | 0 refills | Status: AC | PRN

## 2023-12-07 MED ORDER — DOXYCYCLINE HYCLATE 100 MG PO TABS: 100.0000 mg | ORAL_TABLET | Freq: Two times a day (BID) | ORAL | 0 refills | Status: AC

## 2023-12-07 NOTE — Telephone Encounter (Signed)
 Pt called stating she has been unable to tolerate Doxy. Reports vomiting after multiple doses despite having eaten prior.   Can we either send in alternative treatment, or send anti-emetic to take with the Doxycycline? Pt is agreeable to either solution.   Thanks.

## 2024-02-16 ENCOUNTER — Encounter (HOSPITAL_BASED_OUTPATIENT_CLINIC_OR_DEPARTMENT_OTHER): Payer: Self-pay | Admitting: Emergency Medicine

## 2024-02-16 ENCOUNTER — Ambulatory Visit
Admission: EM | Admit: 2024-02-16 | Discharge: 2024-02-16 | Disposition: A | Discharge status: Home / Self Care | Attending: Family Medicine | Admitting: Family Medicine

## 2024-02-16 ENCOUNTER — Other Ambulatory Visit: Payer: Self-pay

## 2024-02-16 ENCOUNTER — Emergency Department (HOSPITAL_BASED_OUTPATIENT_CLINIC_OR_DEPARTMENT_OTHER)

## 2024-02-16 ENCOUNTER — Emergency Department (HOSPITAL_BASED_OUTPATIENT_CLINIC_OR_DEPARTMENT_OTHER)
Admission: EM | Admit: 2024-02-16 | Discharge: 2024-02-16 | Disposition: A | Discharge status: Home / Self Care | Attending: Emergency Medicine | Admitting: Emergency Medicine

## 2024-02-16 DIAGNOSIS — R079 Chest pain, unspecified: Secondary | ICD-10-CM

## 2024-02-16 LAB — CBC
HCT: 38.2 % (ref 36.0–46.0)
Hemoglobin: 12.3 g/dL (ref 12.0–15.0)
MCH: 28.5 pg (ref 26.0–34.0)
MCHC: 32.2 g/dL (ref 30.0–36.0)
MCV: 88.4 fL (ref 80.0–100.0)
Platelets: 345 10*3/uL (ref 150–400)
RBC: 4.32 MIL/uL (ref 3.87–5.11)
RDW: 13.8 % (ref 11.5–15.5)
WBC: 13 10*3/uL — ABNORMAL HIGH (ref 4.0–10.5)
nRBC: 0 % (ref 0.0–0.2)

## 2024-02-16 LAB — TROPONIN T, HIGH SENSITIVITY
Troponin T High Sensitivity: <6 ng/L (ref 0–19)
Troponin T High Sensitivity: <6 ng/L (ref 0–19)

## 2024-02-16 LAB — BASIC METABOLIC PANEL WITH GFR
Anion gap: 11 (ref 5–15)
BUN: 11 mg/dL (ref 6–20)
CO2: 24 mmol/L (ref 22–32)
Calcium: 9.1 mg/dL (ref 8.9–10.3)
Chloride: 106 mmol/L (ref 98–111)
Creatinine, Ser: 0.97 mg/dL (ref 0.44–1.00)
GFR, Estimated: >60 mL/min
Glucose, Bld: 91 mg/dL (ref 70–99)
Potassium: 4 mmol/L (ref 3.5–5.1)
Sodium: 141 mmol/L (ref 135–145)

## 2024-02-16 LAB — PREGNANCY, URINE: Preg Test, Ur: NEGATIVE

## 2024-02-16 LAB — D-DIMER, QUANTITATIVE: D-Dimer, Quant: 0.37 ug{FEU}/mL (ref 0.00–0.50)

## 2024-02-16 MED ORDER — KETOROLAC TROMETHAMINE 15 MG/ML IJ SOLN
15.0000 mg | Freq: Once | INTRAMUSCULAR | Status: AC
  Administered 2024-02-16: 15 mg via INTRAVENOUS
  Filled 2024-02-16: qty 1

## 2024-02-16 NOTE — ED Triage Notes (Addendum)
 Pt present with c/o chest pressure and discomfort x 1 week. States the pain is not sharp. Pt states the chest pain is worse today. Pt states it started after she had lunch around 1400. Pt states she feels discomfort on her lt side and feels a burning sensation in her chest. The chest discomfort last around 15 minutes.  Pt denies dizziness, lt arm pain and blurred vision. Pt states she feels sob. She feels she should be breathing longer.

## 2024-02-16 NOTE — Discharge Instructions (Addendum)
Please go to the emergency room for further evaluation of your chest pain

## 2024-02-16 NOTE — ED Triage Notes (Signed)
 Pt c/o L chest pain since appx 1430 today.  Denies medication changes.

## 2024-02-16 NOTE — ED Provider Notes (Signed)
 " [facility]    CSN: [id number] Arrival date & time: 02/16/24  1707      History   Chief Complaint Chief Complaint  Patient presents with   Chest Pain    HPI Courtney Moses is a 34 y.o. female presents for chest pain.  Patient reports 1 week of an intermittent left-sided chest pressure that radiates into her left shoulder.  She states today it has been more persistent and instead of lasting a few minutes this lasted hours.  She does endorse some shortness of breath, nausea, palpitations but denies syncope, dizziness, headache, cough or congestion or strenuous activity.  Denies history of diabetes, hyperlipidemia or hypertension.  No family history of CAD.  Does state symptoms seem to worsen with exertion but cannot identify any alleviating factors.  No asthma history.  She does occasionally smoke marijuana.  Denies tobacco use.  Does not use any estrogen birth control, does have a ParaGard IUD.  No recent long distance travel or lower extremity swelling.  No other concerns.   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 Authorizing Provider  fluconazole  (DIFLUCAN ) 150 MG tablet Take 1 tablet PO once. Repeat in 3 days if needed. 08/04/23   Gladis Elsie BROCKS, PA-C   ondansetron  (ZOFRAN ) 4 MG tablet Take 1 tablet (4 mg total) by mouth every 8 (eight) hours as needed for nausea or vomiting. 12/07/23   Hermanns, Ashlee P, PA-C    Family History Family History  Problem Relation Age of Onset   Hypertension Mother    Diabetes Mother    Healthy Father     Social History Social History[1]   Allergies   Patient has no known allergies.   Review of Systems Review of Systems  Respiratory:  Positive for shortness of breath.   Cardiovascular:  Positive for chest pain.  Gastrointestinal:  Positive for nausea.     Physical Exam Triage Vital Signs ED Triage Vitals  Encounter Vitals Group     BP 02/16/24 1716 138/84     Girls Systolic BP Percentile --      Girls Diastolic BP Percentile --      Boys Systolic BP Percentile --      Boys Diastolic BP Percentile --      Pulse Rate 02/16/24 1716 87     Resp 02/16/24 1716 16     Temp 02/16/24 1716 99.3 F (37.4 C)     Temp src --  SpO2 02/16/24 1716 96 %     Weight --      Height --      Head Circumference --      Peak Flow --      Pain Score 02/16/24 1714 7     Pain Loc --      Pain Education --      Exclude from Growth Chart --    No data found.  Updated Vital Signs BP 138/84 (BP Location: Left Arm)   Pulse 87   Temp 99.3 F (37.4 C)   Resp 16   LMP 01/19/2024 (Exact Date)   SpO2 96%   Visual Acuity Right Eye Distance:   Left Eye Distance:   Bilateral Distance:    Right Eye Near:   Left Eye Near:    Bilateral Near:     Physical Exam Vitals and nursing note reviewed.  Constitutional:      General: She is not in acute distress.    Appearance: Normal appearance. She is obese. She is not ill-appearing, toxic-appearing or diaphoretic.  HENT:     Head: Normocephalic and atraumatic.  Eyes:     Pupils: Pupils are equal, round, and reactive to light.  Cardiovascular:     Rate and Rhythm: Normal rate and regular rhythm.     Heart sounds: Normal heart sounds.  Pulmonary:      Effort: Pulmonary effort is normal.     Breath sounds: No wheezing, rhonchi or rales.  Chest:     Chest wall: Tenderness present.  Skin:    General: Skin is warm and dry.  Neurological:     General: No focal deficit present.     Mental Status: She is alert and oriented to person, place, and time.  Psychiatric:        Mood and Affect: Mood normal.        Behavior: Behavior normal.    PERC Criteria for low probability for Pulmonary Embolism  Age <50 years  Heart rate <100 beats/minute  Oxyhemoglobin saturation >=95 percent  No hemoptysis  No estrogen use  No prior DVT or PE  No unilateral leg swelling  No surgery/trauma requiring hospitalization within the prior four weeks  Total score: 0   Marburg score   Known clinical vascular disease  Age/sex (F >=65 years or M >=55 years)  Increased pain with exercise  Pain not reproducible by palpation  Patient assumes pain is of cardiac origin  Score ranges from 0 to 5 points. One point for each answer 0-2 points: negative result 3-5 points: positive result  Total score:  1 Interchest score  History of CAD  Age/sex (F >=65 years or M >=55 years)  Increased pain with exercise  Pain reproducible by palpation (-1 if yes) Physician assumes cardiac origin  Pain feels like pressure   Score ranges from ?1 to +5 points. One point for each answer except reproducible by palpation  <2 points: CAD negative 2-5 points: CAD positive   Total score: 1  UC Treatments / Results  Labs (all labs ordered are listed, but only abnormal results are displayed) Labs Reviewed - No data to display  EKG   Radiology No results found.  Procedures ED EKG  Date/Time: 02/16/2024 5:54 PM  Performed by: Loreda Myla SAUNDERS, NP Authorized by: Loreda Myla SAUNDERS, NP   ECG interpreted by ED Physician in the absence of a cardiologist: no   Interpretation:    Interpretation: normal   Rate:    ECG  rate:  95   ECG rate assessment: normal    Rhythm:    Rhythm: sinus rhythm   Ectopy:    Ectopy: none   QRS:    QRS axis:  Normal ST segments:    ST segments:  Normal T waves:    T waves: normal    (including critical care time)  Medications Ordered in UC Medications - No data to display  Initial Impression / Assessment and Plan / UC Course  I have reviewed the triage vital signs and the nursing notes.  Pertinent labs & imaging results that were available during my care of the patient were reviewed by me and considered in my medical decision making (see chart for details).     Reviewed exam and symptoms with patient.  Discussed limitations and abilities of urgent care.  Patient presenting with a worsening left-sided chest pressure that radiates into her forearm.  EKG shows sinus rhythm with no acute ST-T wave changes.  While her PERC score is as well as interchest and marburg score showed low probability given her presentation I feel it is reasonable to rule out any underlying cardiac causes.  Advised patient go to the emergency room for further evaluation.  She is in agreement with plan and will go POV to the ER. Final Clinical Impressions(s) / UC Diagnoses   Final diagnoses:  Chest pain, unspecified type     Discharge Instructions      Please go to the emergency room for further evaluation of your chest pain    ED Prescriptions   None    PDMP not reviewed this encounter.     [1]  Social History Tobacco Use   Smoking status: Former    Types: Cigarettes   Smokeless tobacco: Never  Vaping Use   Vaping status: Never Used  Substance Use Topics   Alcohol use: Yes    Comment: occassionally   Drug use: No     Loreda Myla SAUNDERS, NP 02/16/24 1755  "

## 2024-02-16 NOTE — ED Provider Notes (Signed)
 " [facility] Provider Note   CSN: [id number] Arrival date & time: 02/16/24  8190     Patient presents with: Chest Pain   Courtney Moses is a 34 y.o. female here for evaluation of chest pain.  States it started around 230 today.  Pain to the left chest.  Feels like she cannot take a deep breath.  She has a cough and some mucus production as she uses marijuana however states this is chronic.  No vomiting, fever, back pain, pain or swelling to lower legs, abdominal pain.  She did note about a month ago she had some swelling to her feet which self resolved.  No history of PE or DVT.  No recent surgery, immobilization, malignancy.  No known sick contacts.  She has never had a thing like this before is typically able to complete her ADLs without any difficulty symptoms not exertional in nature.  Constant.  Was seen at urgent care prior to arrival.  Apparently there they noted she had had 1 week of intermittent left-sided chest pain however today became more persistent.   HPI     Prior to Admission medications  Medication Sig Start Date End Date Taking? Authorizing Provider  fluconazole  (DIFLUCAN ) 150 MG tablet Take 1 tablet PO once. Repeat in 3 days if needed. 08/04/23   Gladis Elsie BROCKS, PA-C  ondansetron  (ZOFRAN ) 4 MG tablet Take 1 tablet (4 mg total) by mouth every 8 (eight) hours as needed for nausea or vomiting. 12/07/23   Hermanns, Ashlee P, PA-C    Allergies: Patient has no known allergies.    Review of Systems  Constitutional: Negative.   HENT: Negative.    Respiratory: Negative.    Cardiovascular:  Positive for chest pain. Negative for palpitations and leg swelling.  Gastrointestinal: Negative.   Genitourinary: Negative.   Musculoskeletal: Negative.   Skin: Negative.   Neurological: Negative.   All other systems reviewed and are negative.   Updated Vital Signs BP 128/79 (BP Location: Left Arm)   Pulse 78   Temp 98.9 F  (37.2 C)   Resp 16   Ht 5' 4 (1.626 m)   Wt 131.5 kg   LMP 01/19/2024 (Exact Date)   SpO2 100%   BMI 49.78 kg/m   Physical Exam Vitals and nursing note reviewed.  Constitutional:      General: She is not in acute distress.    Appearance: She is well-developed. She is not ill-appearing, toxic-appearing or diaphoretic.  HENT:     Head: Atraumatic.  Eyes:     Pupils: Pupils are equal, round, and reactive to light.  Cardiovascular:     Rate and Rhythm: Normal rate and regular rhythm.     Pulses:          Radial pulses are 2+ on the right side and 2+ on the left side.     Heart sounds: Normal heart sounds.  Pulmonary:     Effort: Pulmonary effort is normal. No respiratory distress.     Breath sounds: Normal breath sounds.     Comments: Clear bilaterally, speaks in full sentences without difficulty Chest:     Comments: Nontender chest wall.  No crepitus or step-off Abdominal:     General: Bowel sounds are normal. There is no distension.     Palpations: Abdomen is soft.     Comments: Soft, nontender  Musculoskeletal:        General: Normal range of motion.  Cervical back: Normal range of motion and neck supple.     Right lower leg: No tenderness. No edema.     Left lower leg: No tenderness. No edema.     Comments: No bony tenderness, compartments soft, ranges at legs without difficulty  Skin:    General: Skin is warm and dry.     Capillary Refill: Capillary refill takes less than 2 seconds.     Comments: No obvious rashes or lesions on exposed  Neurological:     General: No focal deficit present.     Mental Status: She is alert and oriented to person, place, and time.     (all labs ordered are listed, but only abnormal results are displayed) Labs Reviewed  CBC - Abnormal; Notable for the following components:      Result Value   WBC 13.0 (*)    All other components within normal limits  BASIC METABOLIC PANEL WITH GFR  PREGNANCY, URINE  D-DIMER, QUANTITATIVE   TROPONIN T, HIGH SENSITIVITY  TROPONIN T, HIGH SENSITIVITY    EKG: None  Radiology: DG Chest 2 View Result Date: 02/16/2024 EXAM: 2 VIEW(S) XRAY OF THE CHEST 02/16/2024 06:55:00 PM COMPARISON: 06/23/2020 CLINICAL HISTORY: Left-sided chest pain. FINDINGS: LUNGS AND PLEURA: No focal pulmonary opacity. No pleural effusion. No pneumothorax. HEART AND MEDIASTINUM: No acute abnormality of the cardiac and mediastinal silhouettes. BONES AND SOFT TISSUES: No acute osseous abnormality. IMPRESSION: 1. No acute cardiopulmonary pathology. Electronically signed by: Oneil Devonshire MD 02/16/2024 07:18 PM EST RP Workstation: [id number]     Procedures   Medications Ordered in the ED  ketorolac  (TORADOL ) 15 MG/ML injection 15 mg (15 mg Intravenous Given 02/16/24 2032)   34 year old with no significant past medical history here for evaluation of chest pain.  Notes she has had constant chest pain since around 230 this afternoon.  Nonexertional, nonpleuritic in nature however states she feels like she cannot take a deep breath.  No history of PE or DVT.  No unilateral leg swelling, redness, warmth.  No recent surgery, immobilization or malignancy.  No associated diaphoresis.  Here patient is neurovascularly intact.  Heart and lungs are clear.  Abdomen soft, tender.  She does not appear grossly fluid overloaded.  Declined analgesia at this time will plan on labs, imaging, reassess  Labs and imaging personally viewed interpreted: CBC leukocytosis 13 Metabolic panel without significant abnormality Pregnancy test negative D-dimer 0.37 Trop neg x 2 EKG wo ischemic changes Chest x-ray without cardiomegaly, pulm edema, pneumothorax  Patient reassessed.  Requesting something for pain.  Will give Toradol .  We discussed her labs and imaging.  Reassuring workup thus far.  Will have her keep a close watch on her symptoms.  At this time I have low suspicion for ACS, unstable angina, PE, dissection, pneumonia,  pneumothorax, myocarditis, pericarditis, endocarditis, Boerhaave rupture, traumatic injury,  intra-abdominal process such as obstruction, perforation, bacterial infectious process, ectopic preg complication.  Will have her follow back up with her primary care provider, return for new or worsening symptoms  The patient has been appropriately medically screened and/or stabilized in the ED. I have low suspicion for any other emergent medical condition which would require further screening, evaluation or treatment in the ED or require inpatient management.  Patient is hemodynamically stable and in no acute distress.  Patient able to ambulate in department prior to ED.  Evaluation does not show acute pathology that would require ongoing or additional emergent interventions while in the emergency department or further inpatient treatment.  I have discussed the diagnosis with the patient and answered all questions.  Pain is been managed while in the emergency department and patient has no further complaints prior to discharge.  Patient is comfortable with plan discussed in room and is stable for discharge at this time.  I have discussed strict return precautions for returning to the emergency department.  Patient was encouraged to follow-up with PCP/specialist refer to at discharge.                                   Medical Decision Making Amount and/or Complexity of Data Reviewed External Data Reviewed: labs, radiology, ECG and notes. Labs: ordered. Decision-making details documented in ED Course. Radiology: ordered and independent interpretation performed. Decision-making details documented in ED Course. ECG/medicine tests: ordered and independent interpretation performed. Decision-making details documented in ED Course.  Risk OTC drugs. Prescription drug management. Parenteral controlled substances. Decision regarding hospitalization. Diagnosis or treatment significantly limited by social  determinants of health.        Final diagnoses:  Nonspecific chest pain    ED Discharge Orders     None          Henessy Rohrer A, PA-C 02/16/24 2215    Tegeler, Lonni PARAS, MD 02/16/24 [phone]  "

## 2024-02-16 NOTE — ED Notes (Signed)
 Patient is being discharged from the Urgent Care and sent to the Emergency Department via POV. Per Myla Bold, NP,  patient is in need of higher level of care due to chest pain. Patient is aware and verbalizes understanding of plan of care.  Vitals:   02/16/24 1716  BP: 138/84  Pulse: 87  Resp: 16  Temp: 99.3 F (37.4 C)  SpO2: 96%

## 2024-02-16 NOTE — Discharge Instructions (Signed)
 It was a pleasure taking care of you here today  As we discussed your workup today was reassuring  I make sure to follow back up with your primary care provider  Return for new or worsening symptoms
